# Patient Record
Sex: Female | Born: 1970 | Race: Black or African American | Hispanic: No | Marital: Single | Smoking: Current every day smoker
Health system: Southern US, Community
[De-identification: ages and names within clinical notes are randomized; demographics above are authoritative.]

## PROBLEM LIST (undated history)

## (undated) DIAGNOSIS — F17200 Nicotine dependence, unspecified, uncomplicated: Secondary | ICD-10-CM

## (undated) DIAGNOSIS — I10 Essential (primary) hypertension: Secondary | ICD-10-CM

## (undated) DIAGNOSIS — Z780 Asymptomatic menopausal state: Secondary | ICD-10-CM

## (undated) DIAGNOSIS — F32A Depression, unspecified: Secondary | ICD-10-CM

## (undated) HISTORY — DX: Depression, unspecified: F32.A

## (undated) HISTORY — DX: Essential (primary) hypertension: I10

## (undated) HISTORY — DX: Asymptomatic menopausal state: Z78.0

## (undated) HISTORY — DX: Nicotine dependence, unspecified, uncomplicated: F17.200

---

## 1998-02-24 ENCOUNTER — Encounter: Admission: RE | Admit: 1998-02-24 | Discharge: 1998-02-24 | Payer: Self-pay | Admitting: Family Medicine

## 1998-04-07 ENCOUNTER — Encounter: Admission: RE | Admit: 1998-04-07 | Discharge: 1998-04-07 | Payer: Self-pay | Admitting: Family Medicine

## 1998-04-14 ENCOUNTER — Emergency Department (HOSPITAL_COMMUNITY): Admission: EM | Admit: 1998-04-14 | Discharge: 1998-04-14 | Payer: Self-pay | Admitting: Emergency Medicine

## 1998-04-19 ENCOUNTER — Encounter: Admission: RE | Admit: 1998-04-19 | Discharge: 1998-04-19 | Payer: Self-pay | Admitting: Family Medicine

## 1998-05-26 ENCOUNTER — Emergency Department (HOSPITAL_COMMUNITY): Admission: EM | Admit: 1998-05-26 | Discharge: 1998-05-26 | Payer: Self-pay | Admitting: Emergency Medicine

## 1998-06-21 ENCOUNTER — Emergency Department (HOSPITAL_COMMUNITY): Admission: EM | Admit: 1998-06-21 | Discharge: 1998-06-21 | Payer: Self-pay | Admitting: Emergency Medicine

## 1998-06-22 ENCOUNTER — Encounter: Payer: Self-pay | Admitting: Emergency Medicine

## 1998-11-29 ENCOUNTER — Encounter: Admission: RE | Admit: 1998-11-29 | Discharge: 1998-11-29 | Payer: Self-pay | Admitting: Family Medicine

## 2006-09-09 ENCOUNTER — Emergency Department (HOSPITAL_COMMUNITY): Admission: EM | Admit: 2006-09-09 | Discharge: 2006-09-09 | Payer: Self-pay | Admitting: Family Medicine

## 2006-09-12 ENCOUNTER — Emergency Department (HOSPITAL_COMMUNITY): Admission: EM | Admit: 2006-09-12 | Discharge: 2006-09-12 | Payer: Self-pay | Admitting: Family Medicine

## 2006-09-14 ENCOUNTER — Emergency Department (HOSPITAL_COMMUNITY): Admission: EM | Admit: 2006-09-14 | Discharge: 2006-09-14 | Payer: Self-pay | Admitting: Family Medicine

## 2006-10-15 ENCOUNTER — Emergency Department (HOSPITAL_COMMUNITY): Admission: EM | Admit: 2006-10-15 | Discharge: 2006-10-15 | Payer: Self-pay | Admitting: Emergency Medicine

## 2007-01-15 ENCOUNTER — Emergency Department (HOSPITAL_COMMUNITY): Admission: EM | Admit: 2007-01-15 | Discharge: 2007-01-15 | Payer: Self-pay | Admitting: Emergency Medicine

## 2007-02-25 ENCOUNTER — Emergency Department (HOSPITAL_COMMUNITY): Admission: EM | Admit: 2007-02-25 | Discharge: 2007-02-25 | Payer: Self-pay | Admitting: Family Medicine

## 2007-09-23 ENCOUNTER — Emergency Department (HOSPITAL_COMMUNITY): Admission: EM | Admit: 2007-09-23 | Discharge: 2007-09-23 | Payer: Self-pay | Admitting: Family Medicine

## 2007-12-11 ENCOUNTER — Emergency Department (HOSPITAL_COMMUNITY): Admission: EM | Admit: 2007-12-11 | Discharge: 2007-12-11 | Payer: Self-pay | Admitting: Family Medicine

## 2008-09-03 ENCOUNTER — Emergency Department (HOSPITAL_COMMUNITY): Admission: EM | Admit: 2008-09-03 | Discharge: 2008-09-03 | Payer: Self-pay | Admitting: Emergency Medicine

## 2009-03-19 ENCOUNTER — Emergency Department (HOSPITAL_COMMUNITY): Admission: EM | Admit: 2009-03-19 | Discharge: 2009-03-19 | Payer: Self-pay | Admitting: Emergency Medicine

## 2010-10-10 ENCOUNTER — Emergency Department (HOSPITAL_COMMUNITY)
Admission: EM | Admit: 2010-10-10 | Discharge: 2010-10-10 | Payer: Self-pay | Source: Home / Self Care | Admitting: Family Medicine

## 2010-10-16 LAB — WET PREP, GENITAL
Clue Cells Wet Prep HPF POC: NONE SEEN
Trich, Wet Prep: NONE SEEN
Yeast Wet Prep HPF POC: NONE SEEN

## 2010-10-16 LAB — GC/CHLAMYDIA PROBE AMP, GENITAL
Chlamydia, DNA Probe: NEGATIVE
GC Probe Amp, Genital: NEGATIVE

## 2010-11-04 ENCOUNTER — Emergency Department (HOSPITAL_COMMUNITY)
Admission: EM | Admit: 2010-11-04 | Discharge: 2010-11-04 | Disposition: A | Discharge status: Home / Self Care | Payer: Self-pay | Attending: Emergency Medicine | Admitting: Emergency Medicine

## 2010-11-04 DIAGNOSIS — J020 Streptococcal pharyngitis: Secondary | ICD-10-CM | POA: Insufficient documentation

## 2010-11-04 DIAGNOSIS — R05 Cough: Secondary | ICD-10-CM | POA: Insufficient documentation

## 2010-11-04 DIAGNOSIS — R059 Cough, unspecified: Secondary | ICD-10-CM | POA: Insufficient documentation

## 2010-11-04 DIAGNOSIS — J3489 Other specified disorders of nose and nasal sinuses: Secondary | ICD-10-CM | POA: Insufficient documentation

## 2010-11-04 LAB — RAPID STREP SCREEN (MED CTR MEBANE ONLY): Streptococcus, Group A Screen (Direct): POSITIVE — AB

## 2011-06-25 LAB — POCT URINALYSIS DIP (DEVICE)
Bilirubin Urine: NEGATIVE
Glucose, UA: NEGATIVE
Nitrite: NEGATIVE
Operator id: 247071
Protein, ur: 30 — AB
Specific Gravity, Urine: 1.02
Urobilinogen, UA: 0.2
pH: 6

## 2011-06-25 LAB — WET PREP, GENITAL
Trich, Wet Prep: NONE SEEN
Yeast Wet Prep HPF POC: NONE SEEN

## 2011-06-25 LAB — GC/CHLAMYDIA PROBE AMP, GENITAL
Chlamydia, DNA Probe: NEGATIVE
GC Probe Amp, Genital: NEGATIVE

## 2011-06-25 LAB — RPR: RPR Ser Ql: NONREACTIVE

## 2011-06-25 LAB — POCT PREGNANCY, URINE
Operator id: 247071
Preg Test, Ur: NEGATIVE

## 2011-07-06 LAB — WET PREP, GENITAL
Trich, Wet Prep: NONE SEEN
Yeast Wet Prep HPF POC: NONE SEEN

## 2011-07-06 LAB — GC/CHLAMYDIA PROBE AMP, GENITAL
Chlamydia, DNA Probe: NEGATIVE
GC Probe Amp, Genital: NEGATIVE

## 2011-07-06 LAB — POCT URINALYSIS DIP (DEVICE)
Bilirubin Urine: NEGATIVE
Glucose, UA: NEGATIVE mg/dL
Ketones, ur: NEGATIVE mg/dL
Nitrite: NEGATIVE
Protein, ur: NEGATIVE mg/dL
Specific Gravity, Urine: 1.015 (ref 1.005–1.030)
Urobilinogen, UA: 0.2 mg/dL (ref 0.0–1.0)
pH: 6.5 (ref 5.0–8.0)

## 2011-07-06 LAB — RPR: RPR Ser Ql: NONREACTIVE

## 2011-07-06 LAB — POCT PREGNANCY, URINE: Preg Test, Ur: NEGATIVE

## 2013-09-11 ENCOUNTER — Encounter (HOSPITAL_COMMUNITY): Payer: Self-pay | Admitting: Emergency Medicine

## 2013-09-11 ENCOUNTER — Emergency Department (INDEPENDENT_AMBULATORY_CARE_PROVIDER_SITE_OTHER)
Admission: EM | Admit: 2013-09-11 | Discharge: 2013-09-11 | Disposition: A | Discharge status: Home / Self Care | Payer: Self-pay | Source: Home / Self Care | Attending: Family Medicine | Admitting: Family Medicine

## 2013-09-11 ENCOUNTER — Other Ambulatory Visit (HOSPITAL_COMMUNITY)
Admission: RE | Admit: 2013-09-11 | Discharge: 2013-09-11 | Disposition: A | Discharge status: Home / Self Care | Payer: Self-pay | Source: Ambulatory Visit | Attending: Family Medicine | Admitting: Family Medicine

## 2013-09-11 DIAGNOSIS — N760 Acute vaginitis: Secondary | ICD-10-CM | POA: Insufficient documentation

## 2013-09-11 DIAGNOSIS — Z113 Encounter for screening for infections with a predominantly sexual mode of transmission: Secondary | ICD-10-CM | POA: Insufficient documentation

## 2013-09-11 LAB — HIV ANTIBODY (ROUTINE TESTING W REFLEX): HIV: NONREACTIVE

## 2013-09-11 LAB — RPR: RPR Ser Ql: NONREACTIVE

## 2013-09-11 MED ORDER — FLUCONAZOLE 150 MG PO TABS: 150.0000 mg | ORAL_TABLET | Freq: Once | ORAL | Status: DC

## 2013-09-11 MED ORDER — METRONIDAZOLE 500 MG PO TABS: 500.0000 mg | ORAL_TABLET | Freq: Two times a day (BID) | ORAL | Status: DC

## 2013-09-11 NOTE — ED Notes (Signed)
C/o std States she has sx of abd pain and heavy milky green discharge for 4 days now States she does have odor and did use a suppository for the odor.

## 2013-09-11 NOTE — ED Provider Notes (Signed)
Zoe Parker is a 42 y.o. female who presents to Urgent Care today for vaginal discharge associated with a fishy odor and mild pelvic discomfort. This is been present for the past 4 days. This is somewhat consistent with prior episodes of bacterial vaginosis. She tried an over-the-counter vaginal suppository which did not help much. She denies any fevers or chills nausea vomiting or diarrhea. She feels well otherwise. She is concerned about the possibility of sexually-transmitted infection.    History reviewed. No pertinent past medical history. History  Substance Use Topics  . Smoking status: Not on file  . Smokeless tobacco: Not on file  . Alcohol Use: Not on file   ROS as above Medications reviewed. No current facility-administered medications for this encounter.   Current Outpatient Prescriptions  Medication Sig Dispense Refill  . fluconazole (DIFLUCAN) 150 MG tablet Take 1 tablet (150 mg total) by mouth once.  1 tablet  1  . metroNIDAZOLE (FLAGYL) 500 MG tablet Take 1 tablet (500 mg total) by mouth 2 (two) times daily.  14 tablet  0    Exam:  BP 178/104  Pulse 78  Temp(Src) 98.5 F (36.9 C) (Oral)  SpO2 100%  LMP 08/29/2013 Gen: Well NAD Abd: Soft. NT, ND Exts: Non edematous BL  LE, warm and well perfused.  GYN: Normal external genitalia. Vaginal canal with white to yellowish discharge. Cervix is normal appearing and nontender. No adnexal masses.   Assessment and Plan: 42 y.o. female with vaginal discharge. Likely bacterial vaginosis. Will treat empirically for BV and yeast with metronidazole and fluconazole. Additionally vaginal secretions cytology for gonorrhea, Chlamydia, trichomonas, BV and yeast are pending.  Will call patient with results. Additionally serologic test for HIV and syphilis are pending as well. Discussed warning signs or symptoms. Please see discharge instructions. Patient expresses understanding.      Rodolph Bong, MD 09/11/13 [phone]

## 2013-09-17 NOTE — ED Notes (Signed)
GC/Chlamydia neg., Affirm: Candida and Trich neg., Gardnerella pos., HIV/RPR non-reactive. Pt. adequately treated with Flagyl. Vassie Moselle 09/17/2013

## 2015-04-15 ENCOUNTER — Emergency Department (HOSPITAL_COMMUNITY)
Admission: EM | Admit: 2015-04-15 | Discharge: 2015-04-15 | Disposition: A | Discharge status: Home / Self Care | Payer: Self-pay | Attending: Emergency Medicine | Admitting: Emergency Medicine

## 2015-04-15 ENCOUNTER — Encounter (HOSPITAL_COMMUNITY): Payer: Self-pay

## 2015-04-15 DIAGNOSIS — Z720 Tobacco use: Secondary | ICD-10-CM | POA: Insufficient documentation

## 2015-04-15 DIAGNOSIS — Z79899 Other long term (current) drug therapy: Secondary | ICD-10-CM | POA: Insufficient documentation

## 2015-04-15 DIAGNOSIS — J40 Bronchitis, not specified as acute or chronic: Secondary | ICD-10-CM

## 2015-04-15 DIAGNOSIS — J209 Acute bronchitis, unspecified: Secondary | ICD-10-CM | POA: Insufficient documentation

## 2015-04-15 MED ORDER — ALBUTEROL SULFATE HFA 108 (90 BASE) MCG/ACT IN AERS
2.0000 | INHALATION_SPRAY | RESPIRATORY_TRACT | Status: DC | PRN
  Filled 2015-04-15: qty 6.7

## 2015-04-15 MED ORDER — AZITHROMYCIN 250 MG PO TABS
500.0000 mg | ORAL_TABLET | Freq: Once | ORAL | Status: AC
  Administered 2015-04-15: 500 mg via ORAL
  Filled 2015-04-15: qty 2

## 2015-04-15 MED ORDER — PREDNISONE 20 MG PO TABS
60.0000 mg | ORAL_TABLET | Freq: Once | ORAL | Status: AC
  Administered 2015-04-15: 60 mg via ORAL
  Filled 2015-04-15: qty 3

## 2015-04-15 MED ORDER — AZITHROMYCIN 250 MG PO TABS: 250.0000 mg | ORAL_TABLET | Freq: Every day | ORAL | Status: DC

## 2015-04-15 MED ORDER — PREDNISONE 20 MG PO TABS: 60.0000 mg | ORAL_TABLET | Freq: Every day | ORAL | Status: AC

## 2015-04-15 NOTE — ED Notes (Signed)
Per pt, she states that she caught a cold about 2 months ago and has been coughing ever since. The pt states that the cough is productive, with thick sputum ranging from green, yellow, white, and clear. Pt states that she has attempted to treat the cough at home with various medications but has been unsuccessful.

## 2015-04-15 NOTE — Discharge Instructions (Signed)
Acute Bronchitis Bronchitis is inflammation of the airways that extend from the windpipe into the lungs (bronchi). The inflammation often causes mucus to develop. This leads to a cough, which is the most common symptom of bronchitis.  In acute bronchitis, the condition usually develops suddenly and goes away over time, usually in a couple weeks. Smoking, allergies, and asthma can make bronchitis worse. Repeated episodes of bronchitis may cause further lung problems.  CAUSES Acute bronchitis is most often caused by the same virus that causes a cold. The virus can spread from person to person (contagious) through coughing, sneezing, and touching contaminated objects. SIGNS AND SYMPTOMS   Cough.   Fever.   Coughing up mucus.   Body aches.   Chest congestion.   Chills.   Shortness of breath.   Sore throat.  DIAGNOSIS  Acute bronchitis is usually diagnosed through a physical exam. Your health care provider will also ask you questions about your medical history. Tests, such as chest X-rays, are sometimes done to rule out other conditions.  TREATMENT  Acute bronchitis usually goes away in a couple weeks. Oftentimes, no medical treatment is necessary. Medicines are sometimes given for relief of fever or cough. Antibiotic medicines are usually not needed but may be prescribed in certain situations. In some cases, an inhaler may be recommended to help reduce shortness of breath and control the cough. A cool mist vaporizer may also be used to help thin bronchial secretions and make it easier to clear the chest.  HOME CARE INSTRUCTIONS  Get plenty of rest.   Drink enough fluids to keep your urine clear or pale yellow (unless you have a medical condition that requires fluid restriction). Increasing fluids may help thin your respiratory secretions (sputum) and reduce chest congestion, and it will prevent dehydration.   Take medicines only as directed by your health care provider.  If  you were prescribed an antibiotic medicine, finish it all even if you start to feel better.  Avoid smoking and secondhand smoke. Exposure to cigarette smoke or irritating chemicals will make bronchitis worse. If you are a smoker, consider using nicotine gum or skin patches to help control withdrawal symptoms. Quitting smoking will help your lungs heal faster.   Reduce the chances of another bout of acute bronchitis by washing your hands frequently, avoiding people with cold symptoms, and trying not to touch your hands to your mouth, nose, or eyes.   Keep all follow-up visits as directed by your health care provider.  SEEK MEDICAL CARE IF: Your symptoms do not improve after 1 week of treatment.  SEEK IMMEDIATE MEDICAL CARE IF:  You develop an increased fever or chills.   You have chest pain.   You have severe shortness of breath.  You have bloody sputum.   You develop dehydration.  You faint or repeatedly feel like you are going to pass out.  You develop repeated vomiting.  You develop a severe headache. MAKE SURE YOU:   Understand these instructions.  Will watch your condition.  Will get help right away if you are not doing well or get worse. Document Released: 10/25/2004 Document Revised: 02/01/2014 Document Reviewed: 03/10/2013 ExitCare Patient Information 2015 [employer]. This information is not intended to replace advice given to you by your health care provider. Make sure you discuss any questions you have with your health care provider. Smoking Cessation Quitting smoking is important to your health and has many advantages. However, it is not always easy to quit since nicotine is   a very addictive drug. Oftentimes, people try 3 times or more before being able to quit. This document explains the best ways for you to prepare to quit smoking. Quitting takes hard work and a lot of effort, but you can do it. ADVANTAGES OF QUITTING SMOKING  You will live longer, feel  better, and live better.  Your body will feel the impact of quitting smoking almost immediately.  Within 20 minutes, blood pressure decreases. Your pulse returns to its normal level.  After 8 hours, carbon monoxide levels in the blood return to normal. Your oxygen level increases.  After 24 hours, the chance of having a heart attack starts to decrease. Your breath, hair, and body stop smelling like smoke.  After 48 hours, damaged nerve endings begin to recover. Your sense of taste and smell improve.  After 72 hours, the body is virtually free of nicotine. Your bronchial tubes relax and breathing becomes easier.  After 2 to 12 weeks, lungs can hold more air. Exercise becomes easier and circulation improves.  The risk of having a heart attack, stroke, cancer, or lung disease is greatly reduced.  After 1 year, the risk of coronary heart disease is cut in half.  After 5 years, the risk of stroke falls to the same as a nonsmoker.  After 10 years, the risk of lung cancer is cut in half and the risk of other cancers decreases significantly.  After 15 years, the risk of coronary heart disease drops, usually to the level of a nonsmoker.  If you are pregnant, quitting smoking will improve your chances of having a healthy baby.  The people you live with, especially any children, will be healthier.  You will have extra money to spend on things other than cigarettes. QUESTIONS TO THINK ABOUT BEFORE ATTEMPTING TO QUIT You may want to talk about your answers with your health care provider.  Why do you want to quit?  If you tried to quit in the past, what helped and what did not?  What will be the most difficult situations for you after you quit? How will you plan to handle them?  Who can help you through the tough times? Your family? Friends? A health care provider?  What pleasures do you get from smoking? What ways can you still get pleasure if you quit? Here are some questions to ask  your health care provider:  How can you help me to be successful at quitting?  What medicine do you think would be best for me and how should I take it?  What should I do if I need more help?  What is smoking withdrawal like? How can I get information on withdrawal? GET READY  Set a quit date.  Change your environment by getting rid of all cigarettes, ashtrays, matches, and lighters in your home, car, or work. Do not let people smoke in your home.  Review your past attempts to quit. Think about what worked and what did not. GET SUPPORT AND ENCOURAGEMENT You have a better chance of being successful if you have help. You can get support in many ways.  Tell your family, friends, and coworkers that you are going to quit and need their support. Ask them not to smoke around you.  Get individual, group, or telephone counseling and support. Programs are available at local hospitals and health centers. Call your local health department for information about programs in your area.  Spiritual beliefs and practices may help some smokers quit.  Download   a "quit meter" on your computer to keep track of quit statistics, such as how long you have gone without smoking, cigarettes not smoked, and money saved.  Get a self-help book about quitting smoking and staying off tobacco. LEARN NEW SKILLS AND BEHAVIORS  Distract yourself from urges to smoke. Talk to someone, go for a walk, or occupy your time with a task.  Change your normal routine. Take a different route to work. Drink tea instead of coffee. Eat breakfast in a different place.  Reduce your stress. Take a hot bath, exercise, or read a book.  Plan something enjoyable to do every day. Reward yourself for not smoking.  Explore interactive web-based programs that specialize in helping you quit. GET MEDICINE AND USE IT CORRECTLY Medicines can help you stop smoking and decrease the urge to smoke. Combining medicine with the above behavioral  methods and support can greatly increase your chances of successfully quitting smoking.  Nicotine replacement therapy helps deliver nicotine to your body without the negative effects and risks of smoking. Nicotine replacement therapy includes nicotine gum, lozenges, inhalers, nasal sprays, and skin patches. Some may be available over-the-counter and others require a prescription.  Antidepressant medicine helps people abstain from smoking, but how this works is unknown. This medicine is available by prescription.  Nicotinic receptor partial agonist medicine simulates the effect of nicotine in your brain. This medicine is available by prescription. Ask your health care provider for advice about which medicines to use and how to use them based on your health history. Your health care provider will tell you what side effects to look out for if you choose to be on a medicine or therapy. Carefully read the information on the package. Do not use any other product containing nicotine while using a nicotine replacement product.  RELAPSE OR DIFFICULT SITUATIONS Most relapses occur within the first 3 months after quitting. Do not be discouraged if you start smoking again. Remember, most people try several times before finally quitting. You may have symptoms of withdrawal because your body is used to nicotine. You may crave cigarettes, be irritable, feel very hungry, cough often, get headaches, or have difficulty concentrating. The withdrawal symptoms are only temporary. They are strongest when you first quit, but they will go away within 10-14 days. To reduce the chances of relapse, try to:  Avoid drinking alcohol. Drinking lowers your chances of successfully quitting.  Reduce the amount of caffeine you consume. Once you quit smoking, the amount of caffeine in your body increases and can give you symptoms, such as a rapid heartbeat, sweating, and anxiety.  Avoid smokers because they can make you want to  smoke.  Do not let weight gain distract you. Many smokers will gain weight when they quit, usually less than 10 pounds. Eat a healthy diet and stay active. You can always lose the weight gained after you quit.  Find ways to improve your mood other than smoking. FOR MORE INFORMATION  www.smokefree.gov  Document Released: 09/11/2001 Document Revised: 02/01/2014 Document Reviewed: 12/27/2011 ExitCare Patient Information 2015 [employer]. This information is not intended to replace advice given to you by your health care provider. Make sure you discuss any questions you have with your health care provider.  

## 2015-04-15 NOTE — ED Provider Notes (Signed)
CSN: [id number]     Arrival date & time 04/15/15  [id number] History   First MD Initiated Contact with Patient 04/15/15 [phone]     Chief Complaint  Patient presents with  . Cough     (Consider location/radiation/quality/duration/timing/severity/associated sxs/prior Treatment) Patient is a 44 y.o. female presenting with cough. The history is provided by the patient. No language interpreter was used.  Cough Cough characteristics:  Productive Severity:  Moderate Duration:  8 weeks Smoker: yes   Associated symptoms: no chills, no fever, no myalgias, no shortness of breath and no sore throat   Associated symptoms comment:  Cough, worse at night but present during the day, without fever, constant for 2 months. No vomiting. She has tried over-the-counter medications without relief. She has no history of asthma and is on no regular medications.    History reviewed. No pertinent past medical history. History reviewed. No pertinent past surgical history. No family history on file. History  Substance Use Topics  . Smoking status: Current Some Day Smoker  . Smokeless tobacco: Never Used  . Alcohol Use: Yes     Comment: "Occasionally"    OB History    No data available     Review of Systems  Constitutional: Negative for fever and chills.  HENT: Negative.  Negative for congestion and sore throat.   Respiratory: Positive for cough. Negative for shortness of breath.   Cardiovascular: Negative.   Gastrointestinal: Negative.  Negative for vomiting.  Musculoskeletal: Negative.  Negative for myalgias.  Skin: Negative.   Neurological: Negative.       Allergies  Review of patient's allergies indicates no known allergies.  Home Medications   Prior to Admission medications   Medication Sig Start Date End Date Taking? Authorizing Provider  dextromethorphan-guaiFENesin (MUCINEX DM) 30-600 MG per 12 hr tablet Take 1 tablet by mouth 2 (two) times daily as needed for cough.   Yes Historical  Provider, MD  guaiFENesin-dextromethorphan (ROBITUSSIN DM) 100-10 MG/5ML syrup Take 15 mLs by mouth every 4 (four) hours as needed for cough.   Yes Historical Provider, MD  fluconazole (DIFLUCAN) 150 MG tablet Take 1 tablet (150 mg total) by mouth once. Patient not taking: Reported on 04/15/2015 09/11/13   Rodolph BongEvan S Corey, MD  metroNIDAZOLE (FLAGYL) 500 MG tablet Take 1 tablet (500 mg total) by mouth 2 (two) times daily. Patient not taking: Reported on 04/15/2015 09/11/13   Rodolph BongEvan S Corey, MD   BP 161/88 mmHg  Pulse 83  Temp(Src) 99.5 F (37.5 C) (Oral)  Resp 17  SpO2 99%  LMP 04/03/2015 Physical Exam  Constitutional: She is oriented to person, place, and time. She appears well-developed and well-nourished.  HENT:  Head: Normocephalic.  Eyes: Conjunctivae are normal.  Neck: Normal range of motion. Neck supple.  Cardiovascular: Normal rate and regular rhythm.   Pulmonary/Chest: Effort normal. She has no wheezes. She has no rales. She exhibits no tenderness.  Few scattered rhonchi.  Abdominal: Soft. Bowel sounds are normal. There is no tenderness. There is no rebound and no guarding.  Musculoskeletal: Normal range of motion. She exhibits no edema.  Neurological: She is alert and oriented to person, place, and time.  Skin: Skin is warm and dry. No rash noted.  Psychiatric: She has a normal mood and affect.    ED Course  Procedures (including critical care time) Labs Review Labs Reviewed - No data to display  Imaging Review No results found.   EKG Interpretation None      MDM  Final diagnoses:  None    1. Bronchitis 2. Tobacco abuse  Patient is well appearing, in NAD. No wheezing, mild rhonchi, symptoms of 2 months duration. Will provide steroid (3 days), inhaler, and Z-pack. Stable for dischare with normal VS, no hypoxia or compromise.   Elpidio Anis, PA-C 04/15/15 1610  Devoria Albe, MD 04/15/15 [phone]

## 2015-06-03 ENCOUNTER — Emergency Department (HOSPITAL_COMMUNITY)
Admission: EM | Admit: 2015-06-03 | Discharge: 2015-06-03 | Disposition: A | Discharge status: Home / Self Care | Payer: Self-pay | Attending: Emergency Medicine | Admitting: Emergency Medicine

## 2015-06-03 ENCOUNTER — Encounter (HOSPITAL_COMMUNITY): Payer: Self-pay | Admitting: Emergency Medicine

## 2015-06-03 DIAGNOSIS — M549 Dorsalgia, unspecified: Secondary | ICD-10-CM | POA: Insufficient documentation

## 2015-06-03 DIAGNOSIS — R509 Fever, unspecified: Secondary | ICD-10-CM | POA: Insufficient documentation

## 2015-06-03 DIAGNOSIS — Z3202 Encounter for pregnancy test, result negative: Secondary | ICD-10-CM | POA: Insufficient documentation

## 2015-06-03 DIAGNOSIS — Z79899 Other long term (current) drug therapy: Secondary | ICD-10-CM | POA: Insufficient documentation

## 2015-06-03 DIAGNOSIS — R51 Headache: Secondary | ICD-10-CM | POA: Insufficient documentation

## 2015-06-03 DIAGNOSIS — Z720 Tobacco use: Secondary | ICD-10-CM | POA: Insufficient documentation

## 2015-06-03 DIAGNOSIS — R102 Pelvic and perineal pain: Secondary | ICD-10-CM | POA: Insufficient documentation

## 2015-06-03 DIAGNOSIS — R1084 Generalized abdominal pain: Secondary | ICD-10-CM | POA: Insufficient documentation

## 2015-06-03 DIAGNOSIS — R61 Generalized hyperhidrosis: Secondary | ICD-10-CM | POA: Insufficient documentation

## 2015-06-03 LAB — CBC
HCT: 39.5 % (ref 36.0–46.0)
HEMOGLOBIN: 12.8 g/dL (ref 12.0–15.0)
MCH: 26.8 pg (ref 26.0–34.0)
MCHC: 32.4 g/dL (ref 30.0–36.0)
MCV: 82.6 fL (ref 78.0–100.0)
Platelets: 254 10*3/uL (ref 150–400)
RBC: 4.78 MIL/uL (ref 3.87–5.11)
RDW: 14.6 % (ref 11.5–15.5)
WBC: 8 10*3/uL (ref 4.0–10.5)

## 2015-06-03 LAB — COMPREHENSIVE METABOLIC PANEL
ALBUMIN: 3.9 g/dL (ref 3.5–5.0)
ALT: 14 U/L (ref 14–54)
ANION GAP: 9 (ref 5–15)
AST: 18 U/L (ref 15–41)
Alkaline Phosphatase: 59 U/L (ref 38–126)
BUN: 9 mg/dL (ref 6–20)
CO2: 24 mmol/L (ref 22–32)
Calcium: 8.8 mg/dL — ABNORMAL LOW (ref 8.9–10.3)
Chloride: 103 mmol/L (ref 101–111)
Creatinine, Ser: 0.67 mg/dL (ref 0.44–1.00)
GFR calc Af Amer: >60 mL/min (ref >60)
GFR calc non Af Amer: >60 mL/min (ref >60)
GLUCOSE: 137 mg/dL — AB (ref 65–99)
POTASSIUM: 3.1 mmol/L — AB (ref 3.5–5.1)
SODIUM: 136 mmol/L (ref 135–145)
Total Bilirubin: 1 mg/dL (ref 0.3–1.2)
Total Protein: 7.3 g/dL (ref 6.5–8.1)

## 2015-06-03 LAB — URINE MICROSCOPIC-ADD ON

## 2015-06-03 LAB — HIV ANTIBODY (ROUTINE TESTING W REFLEX): HIV Screen 4th Generation wRfx: NONREACTIVE

## 2015-06-03 LAB — WET PREP, GENITAL
Clue Cells Wet Prep HPF POC: NONE SEEN
Trich, Wet Prep: NONE SEEN
Yeast Wet Prep HPF POC: NONE SEEN

## 2015-06-03 LAB — URINALYSIS, ROUTINE W REFLEX MICROSCOPIC
Bilirubin Urine: NEGATIVE
GLUCOSE, UA: NEGATIVE mg/dL
Ketones, ur: NEGATIVE mg/dL
Leukocytes, UA: NEGATIVE
Nitrite: NEGATIVE
PH: 6 (ref 5.0–8.0)
Protein, ur: 30 mg/dL — AB
SPECIFIC GRAVITY, URINE: 1.028 (ref 1.005–1.030)
Urobilinogen, UA: 1 mg/dL (ref 0.0–1.0)

## 2015-06-03 LAB — GC/CHLAMYDIA PROBE AMP (~~LOC~~) NOT AT ARMC
Chlamydia: NEGATIVE
NEISSERIA GONORRHEA: NEGATIVE

## 2015-06-03 LAB — POC URINE PREG, ED: PREG TEST UR: NEGATIVE

## 2015-06-03 LAB — LIPASE, BLOOD: Lipase: 11 U/L — ABNORMAL LOW (ref 22–51)

## 2015-06-03 MED ORDER — NAPROXEN 500 MG PO TABS: 500.0000 mg | ORAL_TABLET | Freq: Two times a day (BID) | ORAL | Status: DC

## 2015-06-03 MED ORDER — OXYCODONE-ACETAMINOPHEN 5-325 MG PO TABS
1.0000 | ORAL_TABLET | Freq: Once | ORAL | Status: AC
  Administered 2015-06-03: 1 via ORAL
  Filled 2015-06-03: qty 1

## 2015-06-03 NOTE — ED Provider Notes (Signed)
CSN: [id number]   Arrival date & time 06/03/15 0032  History  This chart was scribed for Jerelyn Scott, MD by Bethel Born, ED Scribe. This patient was seen in room A02C/A02C and the patient's care was started at 1:38 AM.  Chief Complaint  Patient presents with  . Abdominal Pain    HPI Patient is a 44 y.o. female presenting with abdominal pain. The history is provided by the patient. No language interpreter was used.  Abdominal Pain Pain location:  RLQ Pain quality: aching   Pain severity:  Severe Onset quality:  Gradual Duration:  1 day Timing:  Constant Progression:  Worsening Chronicity:  New Context: not trauma   Relieved by:  Nothing Worsened by:  Nothing tried Ineffective treatments:  None tried Associated symptoms: chills and fever (Subjective)   Associated symptoms: no dysuria, no nausea and no vomiting    Zoe Parker is a 44 y.o. female who presents to the Emergency Department complaining of constant right sided abdominal pain with radiation to right lower back with gradual onset last evening. She describes the pain as aching and rates it 8/10 in severity.  She states symptoms started yesterday with headache, sweating and chills, pain all over body, burning with urination, cloudy urine as well as upper back pain.  Associated symptoms include pelvic pain, right lower back pain, subjective fever, chills, and sweating. She is concerned for a UTI. She states she did not measure her temperature.  Did not try any treatments at home.  Also complains of headache and neck stiffness that she associates with a cold. Pt denies dysuria, nausea, and vomiting.   History reviewed. No pertinent past medical history.  History reviewed. No pertinent past surgical history.  No family history on file.  Social History  Substance Use Topics  . Smoking status: Current Some Day Smoker  . Smokeless tobacco: Never Used  . Alcohol Use: Yes     Comment: "Occasionally"      Review of Systems   Constitutional: Positive for fever (Subjective), chills and diaphoresis.  Gastrointestinal: Positive for abdominal pain. Negative for nausea and vomiting.  Genitourinary: Positive for pelvic pain. Negative for dysuria.  Musculoskeletal: Positive for back pain and neck stiffness.  Neurological: Positive for headaches.  All other systems reviewed and are negative.   Home Medications   Prior to Admission medications   Medication Sig Start Date End Date Taking? Authorizing Provider  albuterol (PROVENTIL HFA;VENTOLIN HFA) 108 (90 BASE) MCG/ACT inhaler Inhale 1-2 puffs into the lungs every 6 (six) hours as needed for wheezing or shortness of breath.   Yes Historical Provider, MD  dextromethorphan-guaiFENesin (MUCINEX DM) 30-600 MG per 12 hr tablet Take 1 tablet by mouth 2 (two) times daily as needed for cough.   Yes Historical Provider, MD  guaiFENesin-dextromethorphan (ROBITUSSIN DM) 100-10 MG/5ML syrup Take 15 mLs by mouth every 4 (four) hours as needed for cough.   Yes Historical Provider, MD  naproxen (NAPROSYN) 500 MG tablet Take 1 tablet (500 mg total) by mouth 2 (two) times daily. 06/03/15   Jerelyn Scott, MD    Allergies  Review of patient's allergies indicates no known allergies.  Triage Vitals: BP 158/99 mmHg  Pulse 96  Temp(Src) 99.2 F (37.3 C) (Oral)  Resp 16  SpO2 97%  LMP 05/20/2015 Vitals reviewed Physical Exam  Physical Examination: General appearance - alert, well appearing, and in no distress Mental status - alert, oriented to person, place, and time Eyes - no conjunctival injection, no scleral icterus  Mouth - mucous membranes moist, pharynx normal without lesions Chest - clear to auscultation, no wheezes, rales or rhonchi, symmetric air entry Heart - normal rate, regular rhythm, normal S1, S2, no murmurs, rubs, clicks or gallops Abdomen - soft, nontender to palpation, , nondistended, no masses or organomegaly, nabs Pelvic - normal external genitalia, vulva, vagina,  cervix, uterus and adnexa, no adnexal tenderness or CMT Back exam - full range of motion, no midline tenderness, mild right paraspinal tenderness in lumbar region, no CVA tenderness Neurological - alert, oriented, normal speech Extremities - peripheral pulses normal, no pedal edema, no clubbing or cyanosis Skin - normal coloration and turgor, no rashes  ED Course  Procedures   DIAGNOSTIC STUDIES: Oxygen Saturation is 97% on RA, normal by my interpretation.    COORDINATION OF CARE: 1:43 AM Discussed treatment plan which includes lab work and pelvic exam with pt at bedside and pt agreed to plan.  Labs Reviewed  WET PREP, GENITAL - Abnormal; Notable for the following:    WBC, Wet Prep HPF POC FEW (*)    All other components within normal limits  LIPASE, BLOOD - Abnormal; Notable for the following:    Lipase 11 (*)    All other components within normal limits  COMPREHENSIVE METABOLIC PANEL - Abnormal; Notable for the following:    Potassium 3.1 (*)    Glucose, Bld 137 (*)    Calcium 8.8 (*)    All other components within normal limits  URINALYSIS, ROUTINE W REFLEX MICROSCOPIC (NOT AT [facility]) - Abnormal; Notable for the following:    APPearance CLOUDY (*)    Hgb urine dipstick MODERATE (*)    Protein, ur 30 (*)    All other components within normal limits  URINE MICROSCOPIC-ADD ON - Abnormal; Notable for the following:    Squamous Epithelial / LPF MANY (*)    Bacteria, UA MANY (*)    All other components within normal limits  URINE CULTURE  CBC  HIV ANTIBODY (ROUTINE TESTING)  POC URINE PREG, ED  GC/CHLAMYDIA PROBE AMP ([place]) NOT AT [facility]    I, No att. providers found, personally reviewed and evaluated these images and lab results as part of my medical decision-making.  Imaging Review No results found.  EKG Interpretation None      MDM   Final diagnoses:  Generalized abdominal pain   Pt presenting with abdominal pain and right sided low back pain in addition  to headache, diffuse body aches, subjective fever.  Abdominal exam is benign, labs are reassuring with no elevation in wbc, no sign of UTI- urine culture is pending.  Pelvic exam was also reassuring, wet prep with few WBCs, no adnexal tenderness or CMT to suggest PID.  Pt feels some improvement after pain meds in the ED.  Discharged with strict return precautions.  Pt agreeable with plan.   I personally performed the services described in this documentation, which was scribed in my presence. The recorded information has been reviewed and is accurate.    Jerelyn Scott, MD 06/04/15 [phone]

## 2015-06-03 NOTE — ED Notes (Addendum)
Pt. presents with multiple complaints : reports RLQ pain , pelvic pain , low back pain , nausea , cloudy urine , chills and upper back pain onset today .

## 2015-06-03 NOTE — ED Notes (Signed)
MD at bedside. 

## 2015-06-03 NOTE — Discharge Instructions (Signed)
Return to the ED with any concerns including vomiting and not able to keep down liquids, fever, worsening pain, decreased level of alertness/lethargy, or any other alarming symptoms

## 2015-06-04 ENCOUNTER — Emergency Department (HOSPITAL_COMMUNITY): Payer: Self-pay

## 2015-06-04 ENCOUNTER — Encounter (HOSPITAL_COMMUNITY): Payer: Self-pay

## 2015-06-04 ENCOUNTER — Emergency Department (HOSPITAL_COMMUNITY)
Admission: EM | Admit: 2015-06-04 | Discharge: 2015-06-04 | Disposition: A | Discharge status: Home / Self Care | Payer: Self-pay | Attending: Emergency Medicine | Admitting: Emergency Medicine

## 2015-06-04 DIAGNOSIS — H53149 Visual discomfort, unspecified: Secondary | ICD-10-CM | POA: Insufficient documentation

## 2015-06-04 DIAGNOSIS — Z79899 Other long term (current) drug therapy: Secondary | ICD-10-CM | POA: Insufficient documentation

## 2015-06-04 DIAGNOSIS — R51 Headache: Secondary | ICD-10-CM | POA: Insufficient documentation

## 2015-06-04 DIAGNOSIS — R112 Nausea with vomiting, unspecified: Secondary | ICD-10-CM | POA: Insufficient documentation

## 2015-06-04 DIAGNOSIS — Z3202 Encounter for pregnancy test, result negative: Secondary | ICD-10-CM | POA: Insufficient documentation

## 2015-06-04 DIAGNOSIS — Z720 Tobacco use: Secondary | ICD-10-CM | POA: Insufficient documentation

## 2015-06-04 DIAGNOSIS — R519 Headache, unspecified: Secondary | ICD-10-CM

## 2015-06-04 DIAGNOSIS — R109 Unspecified abdominal pain: Secondary | ICD-10-CM | POA: Insufficient documentation

## 2015-06-04 LAB — BASIC METABOLIC PANEL
ANION GAP: 8 (ref 5–15)
BUN: 11 mg/dL (ref 6–20)
CALCIUM: 8.8 mg/dL — AB (ref 8.9–10.3)
CO2: 29 mmol/L (ref 22–32)
Chloride: 105 mmol/L (ref 101–111)
Creatinine, Ser: 0.6 mg/dL (ref 0.44–1.00)
GFR calc Af Amer: >60 mL/min (ref >60)
GLUCOSE: 110 mg/dL — AB (ref 65–99)
POTASSIUM: 3.1 mmol/L — AB (ref 3.5–5.1)
SODIUM: 142 mmol/L (ref 135–145)

## 2015-06-04 LAB — URINE CULTURE

## 2015-06-04 LAB — URINALYSIS, ROUTINE W REFLEX MICROSCOPIC
BILIRUBIN URINE: NEGATIVE
GLUCOSE, UA: NEGATIVE mg/dL
HGB URINE DIPSTICK: NEGATIVE
Ketones, ur: NEGATIVE mg/dL
Leukocytes, UA: NEGATIVE
Nitrite: NEGATIVE
PROTEIN: NEGATIVE mg/dL
SPECIFIC GRAVITY, URINE: 1.022 (ref 1.005–1.030)
UROBILINOGEN UA: 1 mg/dL (ref 0.0–1.0)
pH: 7.5 (ref 5.0–8.0)

## 2015-06-04 LAB — CBC WITH DIFFERENTIAL/PLATELET
BASOS ABS: 0 10*3/uL (ref 0.0–0.1)
BASOS PCT: 0 % (ref 0–1)
EOS ABS: 0.1 10*3/uL (ref 0.0–0.7)
EOS PCT: 2 % (ref 0–5)
HCT: 38.2 % (ref 36.0–46.0)
Hemoglobin: 12.4 g/dL (ref 12.0–15.0)
Lymphocytes Relative: 32 % (ref 12–46)
Lymphs Abs: 2.4 10*3/uL (ref 0.7–4.0)
MCH: 27.2 pg (ref 26.0–34.0)
MCHC: 32.5 g/dL (ref 30.0–36.0)
MCV: 83.8 fL (ref 78.0–100.0)
MONO ABS: 0.4 10*3/uL (ref 0.1–1.0)
Monocytes Relative: 6 % (ref 3–12)
Neutro Abs: 4.5 10*3/uL (ref 1.7–7.7)
Neutrophils Relative %: 60 % (ref 43–77)
PLATELETS: 261 10*3/uL (ref 150–400)
RBC: 4.56 MIL/uL (ref 3.87–5.11)
RDW: 14.6 % (ref 11.5–15.5)
WBC: 7.5 10*3/uL (ref 4.0–10.5)

## 2015-06-04 LAB — I-STAT BETA HCG BLOOD, ED (MC, WL, AP ONLY)

## 2015-06-04 MED ORDER — ACETAMINOPHEN 500 MG PO TABS
1000.0000 mg | ORAL_TABLET | Freq: Once | ORAL | Status: AC
  Administered 2015-06-04: 1000 mg via ORAL
  Filled 2015-06-04: qty 2

## 2015-06-04 MED ORDER — SODIUM CHLORIDE 0.9 % IV BOLUS (SEPSIS)
1000.0000 mL | Freq: Once | INTRAVENOUS | Status: AC
  Administered 2015-06-04: 1000 mL via INTRAVENOUS

## 2015-06-04 MED ORDER — KETOROLAC TROMETHAMINE 30 MG/ML IJ SOLN
30.0000 mg | Freq: Once | INTRAMUSCULAR | Status: AC
  Administered 2015-06-04: 30 mg via INTRAVENOUS
  Filled 2015-06-04: qty 1

## 2015-06-04 MED ORDER — METOCLOPRAMIDE HCL 5 MG/ML IJ SOLN
10.0000 mg | Freq: Once | INTRAMUSCULAR | Status: AC
  Administered 2015-06-04: 10 mg via INTRAVENOUS
  Filled 2015-06-04: qty 2

## 2015-06-04 MED ORDER — DEXAMETHASONE SODIUM PHOSPHATE 10 MG/ML IJ SOLN
10.0000 mg | Freq: Once | INTRAMUSCULAR | Status: AC
  Administered 2015-06-04: 10 mg via INTRAVENOUS
  Filled 2015-06-04: qty 1

## 2015-06-04 MED ORDER — DIPHENHYDRAMINE HCL 50 MG/ML IJ SOLN
25.0000 mg | Freq: Once | INTRAMUSCULAR | Status: AC
  Administered 2015-06-04: 25 mg via INTRAVENOUS
  Filled 2015-06-04: qty 1

## 2015-06-04 NOTE — ED Notes (Signed)
Patient transported to CT 

## 2015-06-04 NOTE — ED Notes (Signed)
Pt a+o. Respirations are even and unlabored. ABCs intact.

## 2015-06-04 NOTE — ED Notes (Signed)
Pt given written and verbalized discharge instructions. Pt verbalized understanding.

## 2015-06-04 NOTE — Discharge Instructions (Signed)

## 2015-06-04 NOTE — ED Notes (Signed)
Pt's blood pressure is 189/114. MD aware.

## 2015-06-04 NOTE — ED Provider Notes (Signed)
CSN: [id number]     Arrival date & time 06/04/15  0800 History   First MD Initiated Contact with Patient 06/04/15 [phone]     Chief Complaint  Patient presents with  . Headache     (Consider location/radiation/quality/duration/timing/severity/associated sxs/prior Treatment) Patient is a 44 y.o. female presenting with headaches.  Headache Pain location:  Occipital (bil retroorbital) Quality:  Dull Radiates to:  Does not radiate Onset quality:  Gradual Duration:  4 days Timing:  Constant Progression:  Unchanged Chronicity:  New Similar to prior headaches: no   Context comment:  Visit last night for abd pain with benign wu Relieved by:  Nothing Worsened by:  Neck movement Associated symptoms: abdominal pain, nausea, photophobia and vomiting   Associated symptoms: no back pain, no blurred vision, no congestion, no cough, no diarrhea, no dizziness, no focal weakness and no URI     History reviewed. No pertinent past medical history. History reviewed. No pertinent past surgical history. History reviewed. No pertinent family history. Social History  Substance Use Topics  . Smoking status: Current Some Day Smoker  . Smokeless tobacco: Never Used  . Alcohol Use: Yes     Comment: "Occasionally"    OB History    No data available     Review of Systems  HENT: Negative for congestion.   Eyes: Positive for photophobia. Negative for blurred vision.  Respiratory: Negative for cough.   Gastrointestinal: Positive for nausea, vomiting and abdominal pain. Negative for diarrhea.  Musculoskeletal: Negative for back pain.  Neurological: Positive for headaches. Negative for dizziness and focal weakness.  All other systems reviewed and are negative.     Allergies  Review of patient's allergies indicates no known allergies.  Home Medications   Prior to Admission medications   Medication Sig Start Date End Date Taking? Authorizing Provider  acetaminophen (TYLENOL) 325 MG tablet Take 650  mg by mouth every 4 (four) hours as needed.   Yes Historical Provider, MD  albuterol (PROVENTIL HFA;VENTOLIN HFA) 108 (90 BASE) MCG/ACT inhaler Inhale 1-2 puffs into the lungs every 6 (six) hours as needed for wheezing or shortness of breath.   Yes Historical Provider, MD  dextromethorphan-guaiFENesin (MUCINEX DM) 30-600 MG per 12 hr tablet Take 1 tablet by mouth 2 (two) times daily as needed for cough.   Yes Historical Provider, MD  guaiFENesin-dextromethorphan (ROBITUSSIN DM) 100-10 MG/5ML syrup Take 15 mLs by mouth every 4 (four) hours as needed for cough.   Yes Historical Provider, MD  naproxen (NAPROSYN) 500 MG tablet Take 1 tablet (500 mg total) by mouth 2 (two) times daily. Patient not taking: Reported on 06/04/2015 06/03/15   Jerelyn Scott, MD   BP 134/79 mmHg  Pulse 62  Temp(Src) 99.6 F (37.6 C) (Oral)  Resp 15  Ht  (1.549 m)  Wt 203 lb (92.08 kg)  BMI 38.38 kg/m2  SpO2 97%  LMP 05/20/2015 Physical Exam  Constitutional: She is oriented to person, place, and time. She appears well-developed and well-nourished.  HENT:  Head: Normocephalic and atraumatic.  Right Ear: External ear normal.  Left Ear: External ear normal.  Eyes: Conjunctivae and EOM are normal. Pupils are equal, round, and reactive to light.  Neck: Normal range of motion. Neck supple.  Cardiovascular: Normal rate, regular rhythm, normal heart sounds and intact distal pulses.   Pulmonary/Chest: Effort normal and breath sounds normal.  Abdominal: Soft. Bowel sounds are normal. There is no tenderness.  Musculoskeletal: Normal range of motion.  Neurological: She is alert and  oriented to person, place, and time. She has normal strength and normal reflexes. No cranial nerve deficit or sensory deficit. Coordination normal. GCS eye subscore is 4. GCS verbal subscore is 5. GCS motor subscore is 6.  Skin: Skin is warm and dry.  Vitals reviewed.   ED Course  Procedures (including critical care time) Labs Review Labs  Reviewed  BASIC METABOLIC PANEL - Abnormal; Notable for the following:    Potassium 3.1 (*)    Glucose, Bld 110 (*)    Calcium 8.8 (*)    All other components within normal limits  URINALYSIS, ROUTINE W REFLEX MICROSCOPIC (NOT AT [facility]) - Abnormal; Notable for the following:    APPearance CLOUDY (*)    All other components within normal limits  CBC WITH DIFFERENTIAL/PLATELET  I-STAT BETA HCG BLOOD, ED ([facility], [facility], [facility] ONLY)    Imaging Review Ct Head Wo Contrast  06/04/2015   CLINICAL DATA:  44 year old female with history of frontal headache for 4 days. High blood pressure. Photophobia.  EXAM: CT HEAD WITHOUT CONTRAST  TECHNIQUE: Contiguous axial images were obtained from the base of the skull through the vertex without intravenous contrast.  COMPARISON:  No priors.  FINDINGS: No acute intracranial abnormalities. Specifically, no evidence of acute intracranial hemorrhage, no definite findings of acute/subacute cerebral ischemia, no mass, mass effect, hydrocephalus or abnormal intra or extra-axial fluid collections. Visualized paranasal sinuses and mastoids are well pneumatized. No acute displaced skull fractures are identified.  IMPRESSION: *No acute intracranial abnormalities. *The appearance of the brain is normal.   Electronically Signed   By: Trudie Reed M.D.   On: 06/04/2015 08:50   I have personally reviewed and evaluated these images and lab results as part of my medical decision-making.   EKG Interpretation None      MDM   Final diagnoses:  Acute nonintractable headache, unspecified headache type    44 y.o. female with pertinent PMH as above presents with headache, neck pain.  Seen last night for abd pain with negative wu.  States she has never had these symptoms in the past.  On arrival today vitals and physical exam as above.    Wu unremarkable.  Symptoms relieved by reglan, benadryl, decadron.  DC home in stable condition  I have reviewed all laboratory and imaging studies  if ordered as above  1. Acute nonintractable headache, unspecified headache type         Mirian Mo, MD 06/05/15 [phone]

## 2015-06-04 NOTE — ED Notes (Signed)
Pt sts that last Wednesday her pain started in the head and she was unable to move her neck. Pt noticed both eyes started hurting behind her eyes. Pt reports no history of migraines. Pt sts she has has nausea starting on Thursday. Reports blurry vision. Denies dizziness and lightheaded-ness.

## 2015-06-13 ENCOUNTER — Inpatient Hospital Stay: Payer: Self-pay | Admitting: Family Medicine

## 2015-08-22 ENCOUNTER — Emergency Department (HOSPITAL_COMMUNITY)
Admission: EM | Admit: 2015-08-22 | Discharge: 2015-08-22 | Disposition: A | Discharge status: Home / Self Care | Payer: Worker's Compensation | Attending: Emergency Medicine | Admitting: Emergency Medicine

## 2015-08-22 ENCOUNTER — Emergency Department (HOSPITAL_COMMUNITY): Payer: Worker's Compensation

## 2015-08-22 ENCOUNTER — Encounter (HOSPITAL_COMMUNITY): Payer: Self-pay

## 2015-08-22 DIAGNOSIS — Y9389 Activity, other specified: Secondary | ICD-10-CM | POA: Insufficient documentation

## 2015-08-22 DIAGNOSIS — W230XXA Caught, crushed, jammed, or pinched between moving objects, initial encounter: Secondary | ICD-10-CM | POA: Insufficient documentation

## 2015-08-22 DIAGNOSIS — S6992XA Unspecified injury of left wrist, hand and finger(s), initial encounter: Secondary | ICD-10-CM

## 2015-08-22 DIAGNOSIS — Y990 Civilian activity done for income or pay: Secondary | ICD-10-CM | POA: Insufficient documentation

## 2015-08-22 DIAGNOSIS — S60413A Abrasion of left middle finger, initial encounter: Secondary | ICD-10-CM | POA: Insufficient documentation

## 2015-08-22 DIAGNOSIS — Y9289 Other specified places as the place of occurrence of the external cause: Secondary | ICD-10-CM | POA: Insufficient documentation

## 2015-08-22 DIAGNOSIS — F17200 Nicotine dependence, unspecified, uncomplicated: Secondary | ICD-10-CM | POA: Insufficient documentation

## 2015-08-22 NOTE — ED Provider Notes (Signed)
CSN: [id number]     Arrival date & time 08/22/15  0609 History   First MD Initiated Contact with Patient 08/22/15 0615     Chief Complaint  Patient presents with  . Finger Injury     (Consider location/radiation/quality/duration/timing/severity/associated sxs/prior Treatment) HPI Patient presents to the emergency department with a finger injury that occurred while she was a work.  Patient states she caught her finger in a door.  She states that there is bruising and swelling noted to the distal end of her finger, states that she can move it without difficulty.  States initially the pain was significant but has since subsided somewhat.  Patient states she was sent here by her employer for further evaluation.  Patient denies numbness or weakness in the finger History reviewed. No pertinent past medical history. History reviewed. No pertinent past surgical history. No family history on file. Social History  Substance Use Topics  . Smoking status: Current Some Day Smoker  . Smokeless tobacco: Never Used  . Alcohol Use: Yes     Comment: "Occasionally"    OB History    No data available     Review of Systems   All other systems negative except as documented in the HPI. All pertinent positives and negatives as reviewed in the HPI. Allergies  Review of patient's allergies indicates no known allergies.  Home Medications   Prior to Admission medications   Medication Sig Start Date End Date Taking? Authorizing Provider  acetaminophen (TYLENOL) 325 MG tablet Take 650 mg by mouth every 4 (four) hours as needed.    Historical Provider, MD  albuterol (PROVENTIL HFA;VENTOLIN HFA) 108 (90 BASE) MCG/ACT inhaler Inhale 1-2 puffs into the lungs every 6 (six) hours as needed for wheezing or shortness of breath.    Historical Provider, MD  dextromethorphan-guaiFENesin (MUCINEX DM) 30-600 MG per 12 hr tablet Take 1 tablet by mouth 2 (two) times daily as needed for cough.    Historical Provider, MD    guaiFENesin-dextromethorphan (ROBITUSSIN DM) 100-10 MG/5ML syrup Take 15 mLs by mouth every 4 (four) hours as needed for cough.    Historical Provider, MD  naproxen (NAPROSYN) 500 MG tablet Take 1 tablet (500 mg total) by mouth 2 (two) times daily. Patient not taking: Reported on 06/04/2015 06/03/15   Jerelyn Scott, MD   BP 156/118 mmHg  Pulse 85  Temp(Src) 98.9 F (37.2 C) (Oral)  Resp 17  Ht  (1.575 m)  Wt 203 lb (92.08 kg)  BMI 37.12 kg/m2  SpO2 100%  LMP 08/20/2015 Physical Exam  Constitutional: She is oriented to person, place, and time. She appears well-developed and well-nourished.  HENT:  Head: Normocephalic and atraumatic.  Pulmonary/Chest: Effort normal.  Musculoskeletal:       Left hand: She exhibits tenderness and swelling. She exhibits normal range of motion, normal capillary refill, no deformity and no laceration. Normal sensation noted. Normal strength noted.       Hands: Neurological: She is alert and oriented to person, place, and time. She exhibits normal muscle tone. Coordination normal.  Skin: Skin is warm and dry.  Nursing note and vitals reviewed.   ED Course  Procedures (including critical care time)  Imaging Review No results found. I have personally reviewed and evaluated these images and lab results as part of my medical decision-making.  The patient will be treated for contusion and crush injury of the finger.  Told to return here as needed.  Advised to ice and elevate the finger  Charlestine NightChristopher Arihant Pennings, PA-C 08/24/15 2347  Shon Batonourtney F Horton, MD 08/29/15 [phone]

## 2015-08-22 NOTE — Discharge Instructions (Signed)
Tylenol and Motrin for pain.  Return here as needed.  Ice and elevate the finger

## 2015-08-22 NOTE — ED Notes (Signed)
Pt was at work and smashed finer in door, bruising to L middle digit, pt able to move digit freely.

## 2016-09-15 ENCOUNTER — Emergency Department (HOSPITAL_BASED_OUTPATIENT_CLINIC_OR_DEPARTMENT_OTHER)
Admit: 2016-09-15 | Discharge: 2016-09-15 | Disposition: A | Discharge status: Home / Self Care | Payer: Self-pay | Attending: Emergency Medicine | Admitting: Emergency Medicine

## 2016-09-15 ENCOUNTER — Emergency Department (HOSPITAL_COMMUNITY)
Admission: EM | Admit: 2016-09-15 | Discharge: 2016-09-15 | Disposition: A | Discharge status: Home / Self Care | Payer: Self-pay | Attending: Emergency Medicine | Admitting: Emergency Medicine

## 2016-09-15 ENCOUNTER — Encounter (HOSPITAL_COMMUNITY): Payer: Self-pay | Admitting: *Deleted

## 2016-09-15 DIAGNOSIS — F17210 Nicotine dependence, cigarettes, uncomplicated: Secondary | ICD-10-CM | POA: Insufficient documentation

## 2016-09-15 DIAGNOSIS — M7989 Other specified soft tissue disorders: Secondary | ICD-10-CM

## 2016-09-15 DIAGNOSIS — Y939 Activity, unspecified: Secondary | ICD-10-CM | POA: Insufficient documentation

## 2016-09-15 DIAGNOSIS — M79609 Pain in unspecified limb: Secondary | ICD-10-CM

## 2016-09-15 DIAGNOSIS — M660 Rupture of popliteal cyst: Secondary | ICD-10-CM | POA: Insufficient documentation

## 2016-09-15 DIAGNOSIS — Y990 Civilian activity done for income or pay: Secondary | ICD-10-CM | POA: Insufficient documentation

## 2016-09-15 DIAGNOSIS — X501XXA Overexertion from prolonged static or awkward postures, initial encounter: Secondary | ICD-10-CM | POA: Insufficient documentation

## 2016-09-15 DIAGNOSIS — Y929 Unspecified place or not applicable: Secondary | ICD-10-CM | POA: Insufficient documentation

## 2016-09-15 LAB — CBC
HCT: 36.8 % (ref 36.0–46.0)
HEMOGLOBIN: 11.8 g/dL — AB (ref 12.0–15.0)
MCH: 26.2 pg (ref 26.0–34.0)
MCHC: 32.1 g/dL (ref 30.0–36.0)
MCV: 81.8 fL (ref 78.0–100.0)
Platelets: 292 10*3/uL (ref 150–400)
RBC: 4.5 MIL/uL (ref 3.87–5.11)
RDW: 15.2 % (ref 11.5–15.5)
WBC: 9.3 10*3/uL (ref 4.0–10.5)

## 2016-09-15 LAB — BASIC METABOLIC PANEL
Anion gap: 6 (ref 5–15)
BUN: 19 mg/dL (ref 6–20)
CHLORIDE: 105 mmol/L (ref 101–111)
CO2: 28 mmol/L (ref 22–32)
CREATININE: 0.89 mg/dL (ref 0.44–1.00)
Calcium: 9.1 mg/dL (ref 8.9–10.3)
Glucose, Bld: 126 mg/dL — ABNORMAL HIGH (ref 65–99)
POTASSIUM: 3 mmol/L — AB (ref 3.5–5.1)
SODIUM: 139 mmol/L (ref 135–145)

## 2016-09-15 MED ORDER — POTASSIUM CHLORIDE CRYS ER 20 MEQ PO TBCR
40.0000 meq | EXTENDED_RELEASE_TABLET | Freq: Once | ORAL | Status: AC
  Administered 2016-09-15: 40 meq via ORAL
  Filled 2016-09-15: qty 2

## 2016-09-15 MED ORDER — IBUPROFEN 800 MG PO TABS: 800.0000 mg | ORAL_TABLET | Freq: Three times a day (TID) | ORAL | 0 refills | Status: DC

## 2016-09-15 NOTE — ED Triage Notes (Signed)
Left lower leg pain and swelling.  Pt denies any trauma to this leg.  Pain is most behind the knee.  Pt states that she has had these symptoms for about about a month but it started out mild and has been increasing.

## 2016-09-15 NOTE — ED Notes (Signed)
Pt states that the sleeve feels better, and observed pt range of motion on knee with no issues.

## 2016-09-15 NOTE — ED Notes (Signed)
Ambulated pt from waiting room to TR01, waiting for provider.

## 2016-09-15 NOTE — ED Provider Notes (Signed)
[facility]-EMERGENCY DEPT Provider Note   CSN: [id number] Arrival date & time: 09/15/16  1417  By signing my name below, I, Linna DarnerRussell Turner, attest that this documentation has been prepared under the direction and in the presence of Roxy Horsemanobert Saraann Enneking, PA-C. Electronically Signed: Linna Darnerussell Turner, Scribe. 09/15/2016. 4:05 PM.  History   Chief Complaint Chief Complaint  Patient presents with  . Leg Pain    The history is provided by the patient. No language interpreter was used.     Zoe Parker is a 45 y.o. female who presents to the Emergency Department with a chief complaint of constant, worsening, left lower leg pain for about a month. She reports associated swelling. Pt states her pain is most significant in her left calf. She states she stood up at work earlier today and felt a popping sensation in her left lower leg; she reports her pain and swelling worsened severely at this time. No known trauma or injury to her left lower leg. No h/o blood clot. She denies numbness, weakness, color change, wounds, fever, chills, or any other associated symptoms.   History reviewed. No pertinent past medical history.  There are no active problems to display for this patient.   History reviewed. No pertinent surgical history.  OB History    No data available       Home Medications    Prior to Admission medications   Medication Sig Start Date End Date Taking? Authorizing Provider  Ascorbic Acid (VITAMIN C PO) Take 1 tablet by mouth daily.    Historical Provider, MD  ibuprofen (ADVIL,MOTRIN) 800 MG tablet Take 1 tablet (800 mg total) by mouth 3 (three) times daily. 09/15/16   Roxy Horsemanobert Latravia Southgate, PA-C  naproxen (NAPROSYN) 500 MG tablet Take 1 tablet (500 mg total) by mouth 2 (two) times daily. Patient not taking: Reported on 06/04/2015 06/03/15   Jerelyn ScottMartha Linker, MD    Family History No family history on file.  Social History Social History  Substance Use Topics  . Smoking status: Current Every  Day Smoker    Packs/day: 1.00    Types: Cigarettes  . Smokeless tobacco: Never Used  . Alcohol use Yes     Comment: "Occasionally"      Allergies   Patient has no known allergies.   Review of Systems Review of Systems  Constitutional: Negative for chills and fever.  Musculoskeletal: Positive for joint swelling (left lower leg) and myalgias (left lower leg).  Skin: Negative for color change and wound.  Neurological: Negative for weakness and numbness.     Physical Exam Updated Vital Signs BP (!) 181/104   Pulse 88   Temp 98.2 F (36.8 C) (Oral)   Resp 18   Wt 201 lb 11.2 oz (91.5 kg)   SpO2 95%   BMI 36.89 kg/m   Physical Exam  Constitutional: Pt appears well-developed and well-nourished. No distress.  HENT:  Head: Normocephalic and atraumatic.  Eyes: Conjunctivae are normal.  Neck: Normal range of motion.  Cardiovascular: Normal rate, regular rhythm and intact distal pulses.   Capillary refill < 3 sec  Pulmonary/Chest: Effort normal and breath sounds normal.  Musculoskeletal: Pt exhibits tenderness to left popliteal region. Pt exhibits mild edema.  ROM: 4/5 limited by pain  Neurological: Pt  is alert. Coordination normal.  Sensation 5/5 Strength 4/5 limited by pain  Skin: Skin is warm and dry. Pt is not diaphoretic.  No tenting of the skin  Psychiatric: Pt has a normal mood and affect.  Nursing note  and vitals reviewed.  ED Treatments / Results  Labs (all labs ordered are listed, but only abnormal results are displayed) Labs Reviewed  CBC - Abnormal; Notable for the following:       Result Value   Hemoglobin 11.8 (*)    All other components within normal limits  BASIC METABOLIC PANEL - Abnormal; Notable for the following:    Potassium 3.0 (*)    Glucose, Bld 126 (*)    All other components within normal limits    EKG  EKG Interpretation None       Radiology No results found.  Procedures Procedures (including critical care  time)  DIAGNOSTIC STUDIES: Oxygen Saturation is 95% on RA, adequate by my interpretation.    COORDINATION OF CARE: 4:12 PM Discussed treatment plan with pt at bedside and pt agreed to plan.  Medications Ordered in ED Medications  potassium chloride SA (K-DUR,KLOR-CON) CR tablet 40 mEq (not administered)     Initial Impression / Assessment and Plan / ED Course  I have reviewed the triage vital signs and the nursing notes.  Pertinent labs & imaging results that were available during my care of the patient were reviewed by me and considered in my medical decision making (see chart for details).  Clinical Course     Patient with left leg pain and swelling.  DVT study reveals ruptured Baker's cyst, no DVT.  No evidence of abscess or cellulitis.  Final Clinical Impressions(s) / ED Diagnoses   Patient X-Ray negative for obvious fracture or dislocation.  Pt advised to follow up with orthopedics. Patient given knee sleeve while in ED, conservative therapy recommended and discussed. Patient will be discharged home & is agreeable with above plan. Returns precautions discussed. Pt appears safe for discharge.  Final diagnoses:  Baker's cyst, ruptured    New Prescriptions New Prescriptions   IBUPROFEN (ADVIL,MOTRIN) 800 MG TABLET    Take 1 tablet (800 mg total) by mouth 3 (three) times daily.   I personally performed the services described in this documentation, which was scribed in my presence. The recorded information has been reviewed and is accurate.      Roxy Horsemanobert Yandell Mcjunkins, PA-C 09/15/16 1638    Gwyneth SproutWhitney Plunkett, MD 09/16/16 2045

## 2016-09-15 NOTE — ED Notes (Signed)
Applied knee sleeve per Magda PaganiniAudrey Banker(RN)

## 2016-09-15 NOTE — ED Notes (Signed)
Declined W/C at D/C and was escorted to lobby by RN. 

## 2016-09-15 NOTE — Progress Notes (Signed)
VASCULAR LAB PRELIMINARY  PRELIMINARY  PRELIMINARY  PRELIMINARY  Left lower extremity venous duplex completed.    Preliminary report:  There is no DVT or SVT noted in the left lower extremity.  There is a partially ruptured Baker's cyst noted in the left popliteal fossa with fluid in the proximal calf as well as interstitial fluid noted throughout the left calf.  Called report to SheffieldJamie, Charity fundraiserN.  Cyndia Degraff, RVT 09/15/2016, 3:43 PM

## 2020-06-16 ENCOUNTER — Emergency Department (HOSPITAL_COMMUNITY)
Admission: EM | Admit: 2020-06-16 | Discharge: 2020-06-16 | Disposition: A | Discharge status: Home / Self Care | Payer: BC Managed Care – PPO | Attending: Emergency Medicine | Admitting: Emergency Medicine

## 2020-06-16 ENCOUNTER — Emergency Department (HOSPITAL_COMMUNITY): Payer: BC Managed Care – PPO

## 2020-06-16 ENCOUNTER — Encounter (HOSPITAL_COMMUNITY): Payer: Self-pay | Admitting: Emergency Medicine

## 2020-06-16 DIAGNOSIS — F17210 Nicotine dependence, cigarettes, uncomplicated: Secondary | ICD-10-CM | POA: Insufficient documentation

## 2020-06-16 DIAGNOSIS — Z20822 Contact with and (suspected) exposure to covid-19: Secondary | ICD-10-CM | POA: Diagnosis not present

## 2020-06-16 DIAGNOSIS — R0602 Shortness of breath: Secondary | ICD-10-CM | POA: Insufficient documentation

## 2020-06-16 DIAGNOSIS — Z79899 Other long term (current) drug therapy: Secondary | ICD-10-CM | POA: Insufficient documentation

## 2020-06-16 LAB — BASIC METABOLIC PANEL
Anion gap: 12 (ref 5–15)
BUN: 16 mg/dL (ref 6–20)
CO2: 25 mmol/L (ref 22–32)
Calcium: 9.3 mg/dL (ref 8.9–10.3)
Chloride: 104 mmol/L (ref 98–111)
Creatinine, Ser: 0.59 mg/dL (ref 0.44–1.00)
GFR calc Af Amer: >60 mL/min (ref >60)
GFR calc non Af Amer: >60 mL/min (ref >60)
Glucose, Bld: 91 mg/dL (ref 70–99)
Potassium: 3.2 mmol/L — ABNORMAL LOW (ref 3.5–5.1)
Sodium: 141 mmol/L (ref 135–145)

## 2020-06-16 LAB — SARS CORONAVIRUS 2 BY RT PCR (HOSPITAL ORDER, PERFORMED IN ~~LOC~~ HOSPITAL LAB): SARS Coronavirus 2: NEGATIVE

## 2020-06-16 LAB — CBC
HCT: 37.3 % (ref 36.0–46.0)
Hemoglobin: 11.6 g/dL — ABNORMAL LOW (ref 12.0–15.0)
MCH: 26.4 pg (ref 26.0–34.0)
MCHC: 31.1 g/dL (ref 30.0–36.0)
MCV: 85 fL (ref 80.0–100.0)
Platelets: 258 10*3/uL (ref 150–400)
RBC: 4.39 MIL/uL (ref 3.87–5.11)
RDW: 15.1 % (ref 11.5–15.5)
WBC: 8.7 10*3/uL (ref 4.0–10.5)
nRBC: 0 % (ref 0.0–0.2)

## 2020-06-16 LAB — BRAIN NATRIURETIC PEPTIDE: B Natriuretic Peptide: 108.5 pg/mL — ABNORMAL HIGH (ref 0.0–100.0)

## 2020-06-16 LAB — TROPONIN I (HIGH SENSITIVITY)
Troponin I (High Sensitivity): 6 ng/L (ref <18)
Troponin I (High Sensitivity): 4 ng/L (ref <18)

## 2020-06-16 NOTE — ED Provider Notes (Signed)
[facility] EMERGENCY DEPARTMENT Provider Note   CSN: [id number] Arrival date & time: 06/16/20  1019     History No chief complaint on file.   Zoe Parker is a 49 y.o. female.  HPI    Patient presents with concern of shortness of breath. Patient knowledges a long smoking history, currently 1 pack a day, but states that she is otherwise well. 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 Symptoms persisted, were slightly stronger for several hours, but are now improved, though present. No pain, no fever, no nausea, vomiting, diarrhea, syncope. No recent change in medication, diet, activity. 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 Last vaccine dose was earlier this year. No past medical history on file.  There are no problems to display for this patient.   History reviewed. No pertinent surgical history.   OB History   No obstetric history on file.     No family history on file.  Social History   Tobacco Use  . Smoking status: Current Every Day Smoker    Packs/day: 1.00    Types: Cigarettes  . Smokeless tobacco: Never Used  Substance Use Topics  . Alcohol use: Yes    Comment: "Occasionally"   . Drug use: No    Home Medications Prior to Admission medications   Medication Sig Start Date End Date Taking? Authorizing Provider  Ascorbic Acid (VITAMIN C PO) Take 1 tablet by mouth daily.    [provider]  ibuprofen (ADVIL,MOTRIN) 800 MG tablet Take 1 tablet (800 mg total) by mouth 3 (three) times daily. 09/15/16   Roxy Horseman, PA-C  naproxen (NAPROSYN) 500 MG tablet Take 1 tablet (500 mg total) by mouth 2 (two) times daily. Patient not taking: Reported on 06/04/2015 06/03/15   Phillis Haggis, MD    Allergies    Patient has no known allergies.  Review of Systems   Review of Systems  Constitutional:       Per  HPI, otherwise negative  HENT:       Per HPI, otherwise negative  Respiratory:       Per HPI, otherwise negative  Cardiovascular:       Per HPI, otherwise negative  Gastrointestinal: Negative for vomiting.  Endocrine:       Negative aside from HPI  Genitourinary:       Neg aside from HPI   Musculoskeletal:       Bilateral lower extremity swelling over the course of the workday, improved at rest.  Skin: Negative.   Neurological: Negative for syncope.    Physical Exam Updated Vital Signs BP (!) 181/94   Pulse 71   Temp 98.3 F (36.8 C) (Oral)   Resp 15   Ht 5\' 2"  (1.575 m)   Wt 93 kg   SpO2 100%   BMI 37.49 kg/m   Physical Exam Vitals and nursing note reviewed.  Constitutional:      General: She is not in acute distress.    Appearance: She is well-developed.  HENT:     Head: Normocephalic and atraumatic.  Eyes:     Conjunctiva/sclera: Conjunctivae normal.  Cardiovascular:     Rate and Rhythm: Normal rate and regular rhythm.  Pulmonary:     Effort: Pulmonary effort is normal. No respiratory distress.     Breath sounds: Normal breath sounds. No stridor.  Abdominal:     General: There is no distension.  Skin:    General: Skin is warm and dry.  Neurological:     Mental Status: She is alert and oriented to person, place, and time.     Cranial Nerves: No cranial nerve deficit.      ED Results / Procedures / Treatments   Labs (all labs ordered are listed, but only abnormal results are displayed) Labs Reviewed  BASIC METABOLIC PANEL - Abnormal; Notable for the following components:      Result Value   Potassium 3.2 (*)    All other components within normal limits  CBC - Abnormal; Notable for the following components:   Hemoglobin 11.6 (*)    All other components within normal limits  BRAIN NATRIURETIC PEPTIDE - Abnormal; Notable for the following components:   B Natriuretic Peptide 108.5 (*)    All other components within normal limits  SARS CORONAVIRUS 2  BY RT PCR (HOSPITAL ORDER, PERFORMED IN [facility])  I-STAT BETA HCG BLOOD, ED ([facility], [facility], [facility] ONLY)  TROPONIN I (HIGH SENSITIVITY)  TROPONIN I (HIGH SENSITIVITY)    EKG EKG Interpretation  Date/Time:  Thursday June 16 2020 10:39:24 EDT Ventricular Rate:  76 PR Interval:  240 QRS Duration: 80 QT Interval:  424 QTC Calculation: 477 R Axis:   -53 Text Interpretation: Sinus rhythm with 1st degree A-V block Left axis deviation Cannot rule out Anterior infarct , age undetermined Abnormal ECG Confirmed by Gerhard Munch [phone]) on 06/16/2020 10:15:58 PM   Radiology DG Chest 2 View  Result Date: 06/16/2020 CLINICAL DATA:  Shortness of breath EXAM: CHEST - 2 VIEW COMPARISON:  06/22/2018 FINDINGS: Mild scarring or atelectasis in the bilateral mid chest. Borderline heart size on the frontal view but normal appearing on the lateral view. There is no edema, consolidation, effusion, or pneumothorax. IMPRESSION: Mild atelectasis or scarring.  No edema or consolidation. Electronically Signed   By: Marnee Spring M.D.   On: 06/16/2020 11:12    Procedures Procedures (including critical care time)  Medications Ordered in ED Medications - No data to display  ED Course  I have reviewed the triage vital signs and the nursing notes.  Pertinent labs & imaging results that were available during my care of the patient were reviewed by me and considered in my medical decision making (see chart for details).   11:12 PM Patient in no distress, awake, alert.  She has taken a nap, notes that she feels substantially better. We reviewed all findings including reassuring two normal troponin, nonischemic EKG, and with no ongoing symptoms, patient is appropriate for discharge. Notably, patient does have slight elevation in BNP and given her history of lower extremity swelling, will follow up outpatient with our cardiology colleagues for consideration of echocardiogram.  Final Clinical  Impression(s) / ED Diagnoses Final diagnoses:  SOB (shortness of breath)    MDM Number of Diagnoses or Management Options SOB (shortness of breath): new, needed workup   Amount and/or Complexity of Data Reviewed Clinical lab tests: reviewed Tests in the radiology section of CPT: reviewed Tests in the medicine section of CPT: reviewed Review and summarize past medical records: yes Independent visualization of images, tracings, or specimens: yes  Risk of Complications, Morbidity, and/or Mortality Presenting problems: high Diagnostic procedures: high Management options: high  Critical Care Total time providing critical care: < 30 minutes  Patient Progress Patient progress: stable    Gerhard Munch, MD 06/16/20 2327

## 2020-06-16 NOTE — ED Triage Notes (Signed)
Pt here from work with c/o some slight sob and fluttering in her chest . Pt does work 3 rd shift smokes and drinks a decent amount of caffeine

## 2020-06-16 NOTE — ED Notes (Signed)
Ambulated pt in WR. Initial SPO2 was 99% and went down to 97%. No complaints of SOB or dizziness. Pt was steady on feet and ambulated with no assistance.

## 2020-06-16 NOTE — Discharge Instructions (Addendum)
As discussed, your evaluation today has been largely reassuring.  But, it is important that you monitor your condition carefully, and do not hesitate to return to the ED if you develop new, or concerning changes in your condition.  Otherwise, please follow-up with our cardiology colleagues for consideration of echocardiogram given your lower extremity swelling.

## 2020-07-07 ENCOUNTER — Ambulatory Visit: Payer: BC Managed Care – PPO | Admitting: Internal Medicine

## 2020-07-07 ENCOUNTER — Telehealth: Payer: Self-pay | Admitting: *Deleted

## 2020-07-07 NOTE — Telephone Encounter (Signed)
LVM to contact clinic back due to provider schedule change

## 2020-07-28 ENCOUNTER — Ambulatory Visit: Payer: BC Managed Care – PPO | Admitting: Internal Medicine

## 2020-07-28 NOTE — Progress Notes (Deleted)
Cardiology Office Note:    Date:  07/28/2020   ID:  Zoe Parker, DOB August 01, 1971, MRN [id number]  PCP:  Patient, No Pcp Per  [facility] Cardiologist:  No primary care provider on file.  [facility] Electrophysiologist:  None   CC: Consulted for the evaluation of  at the behest of Dr. Gerhard Munch   History of Present Illness:    Zoe Parker is a 49 y.o. female with a hx of Tobacco Abuse, HTN Urgency  With recent ED Eval 06/06/20  No past medical history on file.  No past surgical history on file.  Current Medications: No outpatient medications have been marked as taking for the 07/28/20 encounter (Appointment) with Christell Constant, MD.     Allergies:   Patient has no known allergies.   Social History   Socioeconomic History  . Marital status: Single    Spouse name: Not on file  . Number of children: Not on file  . Years of education: Not on file  . Highest education level: Not on file  Occupational History  . Not on file  Tobacco Use  . Smoking status: Current Every Day Smoker    Packs/day: 1.00    Types: Cigarettes  . Smokeless tobacco: Never Used  Substance and Sexual Activity  . Alcohol use: Yes    Comment: "Occasionally"   . Drug use: No  . Sexual activity: Not on file  Other Topics Concern  . Not on file  Social History Narrative  . Not on file   Social Determinants of Health   Financial Resource Strain:   . Difficulty of Paying Living Expenses: Not on file  Food Insecurity:   . Worried About Programme researcher, broadcasting/film/video in the Last Year: Not on file  . Ran Out of Food in the Last Year: Not on file  Transportation Needs:   . Lack of Transportation (Medical): Not on file  . Lack of Transportation (Non-Medical): Not on file  Physical Activity:   . Days of Exercise per Week: Not on file  . Minutes of Exercise per Session: Not on file  Stress:   . Feeling of Stress : Not on file  Social Connections:   . Frequency of Communication with  Friends and Family: Not on file  . Frequency of Social Gatherings with Friends and Family: Not on file  . Attends Religious Services: Not on file  . Active Member of Clubs or Organizations: Not on file  . Attends Banker Meetings: Not on file  . Marital Status: Not on file     Family History: The patient's ***family history is not on file.  ROS:   Please see the history of present illness.    *** All other systems reviewed and are negative.  EKGs/Labs/Other Studies Reviewed:    The following studies were reviewed today:  EKG:   9/16/21Sinus rhythm with 1st HB, LAE LAD, poor R wave progression  Recent Labs: 06/16/2020: B Natriuretic Peptide 108.5; BUN 16; Creatinine, Ser 0.59; Hemoglobin 11.6; Platelets 258; Potassium 3.2; Sodium 141    Physical Exam:    VS:  There were no vitals taken for this visit.    Wt Readings from Last 3 Encounters:  06/16/20 205 lb (93 kg)  09/15/16 201 lb 11.2 oz (91.5 kg)  08/22/15 203 lb (92.1 kg)     GEN: *** Well nourished, well developed in no acute distress HEENT: Normal NECK: No JVD; No carotid bruits LYMPHATICS: No lymphadenopathy CARDIAC: ***  RRR, no murmurs, rubs, gallops RESPIRATORY:  Clear to auscultation without rales, wheezing or rhonchi  ABDOMEN: Soft, non-tender, non-distended MUSCULOSKELETAL:  No edema; No deformity  SKIN: Warm and dry NEUROLOGIC:  Alert and oriented x 3 PSYCHIATRIC:  Normal affect   ASSESSMENT:    No diagnosis found. PLAN:    In order of problems listed above:  1. ***   Medication Adjustments/Labs and Tests Ordered: Current medicines are reviewed at length with the patient today.  Concerns regarding medicines are outlined above.  No orders of the defined types were placed in this encounter.  No orders of the defined types were placed in this encounter.   There are no Patient Instructions on file for this visit.   Signed, Christell Constant, MD  07/28/2020 12:46 PM    [facility]

## 2020-08-12 ENCOUNTER — Encounter: Payer: Self-pay | Admitting: General Practice

## 2021-09-18 ENCOUNTER — Encounter (HOSPITAL_COMMUNITY): Payer: Self-pay

## 2021-09-18 ENCOUNTER — Ambulatory Visit (HOSPITAL_COMMUNITY)
Admission: EM | Admit: 2021-09-18 | Discharge: 2021-09-18 | Disposition: A | Discharge status: Home / Self Care | Payer: BC Managed Care – PPO | Attending: Urgent Care | Admitting: Urgent Care

## 2021-09-18 DIAGNOSIS — I10 Essential (primary) hypertension: Secondary | ICD-10-CM | POA: Diagnosis not present

## 2021-09-18 DIAGNOSIS — F439 Reaction to severe stress, unspecified: Secondary | ICD-10-CM | POA: Diagnosis not present

## 2021-09-18 LAB — COMPREHENSIVE METABOLIC PANEL
ALT: 22 U/L (ref 0–44)
AST: 23 U/L (ref 15–41)
Albumin: 3.9 g/dL (ref 3.5–5.0)
Alkaline Phosphatase: 67 U/L (ref 38–126)
Anion gap: 8 (ref 5–15)
BUN: 11 mg/dL (ref 6–20)
CO2: 27 mmol/L (ref 22–32)
Calcium: 9.2 mg/dL (ref 8.9–10.3)
Chloride: 105 mmol/L (ref 98–111)
Creatinine, Ser: 0.6 mg/dL (ref 0.44–1.00)
GFR, Estimated: >60 mL/min (ref >60)
Glucose, Bld: 90 mg/dL (ref 70–99)
Potassium: 3.8 mmol/L (ref 3.5–5.1)
Sodium: 140 mmol/L (ref 135–145)
Total Bilirubin: 0.6 mg/dL (ref 0.3–1.2)
Total Protein: 6.9 g/dL (ref 6.5–8.1)

## 2021-09-18 LAB — CBC
HCT: 39.8 % (ref 36.0–46.0)
Hemoglobin: 12.8 g/dL (ref 12.0–15.0)
MCH: 26.7 pg (ref 26.0–34.0)
MCHC: 32.2 g/dL (ref 30.0–36.0)
MCV: 83.1 fL (ref 80.0–100.0)
Platelets: 287 10*3/uL (ref 150–400)
RBC: 4.79 MIL/uL (ref 3.87–5.11)
RDW: 15 % (ref 11.5–15.5)
WBC: 5.5 10*3/uL (ref 4.0–10.5)
nRBC: 0 % (ref 0.0–0.2)

## 2021-09-18 MED ORDER — AMLODIPINE BESYLATE 5 MG PO TABS: 5.0000 mg | ORAL_TABLET | Freq: Every day | ORAL | 0 refills | Status: DC

## 2021-09-18 NOTE — ED Provider Notes (Signed)
[facility]    CSN: [id number] Arrival date & time: 09/18/21  0802      History   Chief Complaint Chief Complaint  Patient presents with   Hypertension    HPI Zoe Parker is a 50 y.o. female.   Pleasant 50 year old female presents today with concerns regarding her ears.  She states that "for as long as I can remember "her ears feel pressured.  She reports a pulsatile discomfort.  She states her dentist sent her here for evaluation of her blood pressure.  Hoping to have dental extractions performed at the end of January, but had a blood pressure elevated at the dental visit 4 days ago.  She does not recall the systolic but reports her diastolic was 125.  She does not have a PCP but admits to having elevated blood pressure for many years.  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  She states she has intermittent headaches, and has had mild twinges in her chest, but denies any of those symptoms today.  She denies any acute visual changes, although she does admit that her eyes are "changing due to turning 50".  Patient was seen in the ER greater than a year ago and had a mild anemia and hypokalemia, patient did not follow-up with a primary or have this rechecked.  She denies any additional complaints in office today.   Hypertension Pertinent negatives include no chest pain, no headaches and no shortness of breath.   History reviewed. No pertinent past medical history.  There are no problems to display for this patient.   History reviewed. No pertinent surgical history.  OB History   No obstetric history on file.      Home Medications    Prior to Admission medications   Medication Sig Start Date End Date Taking? Authorizing Provider  amLODipine (NORVASC) 5 MG tablet Take 1 tablet (5 mg total) by mouth daily. 09/18/21 10/18/21 Yes Bradey Luzier L, PA  amoxicillin (AMOXIL) 500 MG tablet Take 500 mg by mouth 3 (three) times daily. 09/14/21  Yes  [provider]  Ascorbic Acid (VITAMIN C PO) Take 1 tablet by mouth daily.   Yes [provider]    Family History History reviewed. No pertinent family history.  Social History Social History   Tobacco Use   Smoking status: Every Day    Packs/day: 1.00    Types: Cigarettes   Smokeless tobacco: Never  Vaping Use   Vaping Use: Some days  Substance Use Topics   Alcohol use: Yes    Comment: "Occasionally"    Drug use: No     Allergies   Patient has no known allergies.   Review of Systems Review of Systems  Constitutional:  Negative for appetite change, diaphoresis, fatigue and fever.  HENT:  Positive for dental problem and ear pain. Negative for congestion, drooling, ear discharge, hearing loss, nosebleeds, postnasal drip, rhinorrhea, sinus pain, sore throat and tinnitus.   Eyes:  Negative for pain.  Respiratory:  Negative for cough, chest tightness and shortness of breath.   Cardiovascular:  Negative for chest pain and palpitations.  Musculoskeletal:  Positive for arthralgias (chronic per pt).  Neurological:  Negative for dizziness, syncope, speech difficulty, weakness, light-headedness and headaches.  Psychiatric/Behavioral:  The patient is nervous/anxious.     Physical Exam Triage Vital Signs ED Triage Vitals  Enc Vitals Group     BP 09/18/21 0830 (!) 185/117     Pulse Rate 09/18/21  0830 84     Resp 09/18/21 0830 18     Temp 09/18/21 0830 98.8 F (37.1 C)     Temp Source 09/18/21 0830 Oral     SpO2 09/18/21 0830 99 %     Weight --      Height --      Head Circumference --      Peak Flow --      Pain Score 09/18/21 0834 0     Pain Loc --      Pain Edu? --      Excl. in GC? --    No data found.  Updated Vital Signs BP (!) 185/117 (BP Location: Left Arm)    Pulse 84    Temp 98.8 F (37.1 C) (Oral)    Resp 18    SpO2 99%   Visual Acuity Right Eye Distance:   Left Eye Distance:   Bilateral Distance:    Right Eye Near:   Left Eye  Near:    Bilateral Near:     Physical Exam Vitals and nursing note reviewed.  Constitutional:      General: She is not in acute distress.    Appearance: She is well-developed. She is obese. She is not ill-appearing, toxic-appearing or diaphoretic.  HENT:     Head: Normocephalic and atraumatic.     Right Ear: Tympanic membrane, ear canal and external ear normal. There is no impacted cerumen.     Left Ear: Tympanic membrane, ear canal and external ear normal. There is no impacted cerumen.     Nose: Nose normal. No congestion or rhinorrhea.     Mouth/Throat:     Mouth: Mucous membranes are moist.     Pharynx: Oropharynx is clear. No oropharyngeal exudate or posterior oropharyngeal erythema.  Eyes:     General: No scleral icterus.       Right eye: No discharge.        Left eye: No discharge.     Extraocular Movements: Extraocular movements intact.     Conjunctiva/sclera: Conjunctivae normal.     Pupils: Pupils are equal, round, and reactive to light.  Neck:     Vascular: No carotid bruit.  Cardiovascular:     Rate and Rhythm: Normal rate and regular rhythm.     Pulses: Normal pulses.     Heart sounds: Normal heart sounds. No murmur heard.   No friction rub. No gallop.  Pulmonary:     Effort: Pulmonary effort is normal. No respiratory distress.     Breath sounds: Normal breath sounds. No wheezing or rhonchi.  Chest:     Chest wall: No tenderness.  Abdominal:     Palpations: Abdomen is soft.     Tenderness: There is no abdominal tenderness.  Musculoskeletal:        General: No swelling.     Cervical back: Normal range of motion and neck supple. No rigidity.     Right lower leg: No edema.     Left lower leg: No edema.  Lymphadenopathy:     Cervical: No cervical adenopathy.  Skin:    General: Skin is warm and dry.     Capillary Refill: Capillary refill takes less than 2 seconds.  Neurological:     General: No focal deficit present.     Mental Status: She is alert and  oriented to person, place, and time. Mental status is at baseline.     Cranial Nerves: No cranial nerve deficit.     Motor:  No weakness.     Gait: Gait normal.  Psychiatric:        Mood and Affect: Mood normal.     UC Treatments / Results  Labs (all labs ordered are listed, but only abnormal results are displayed) Labs Reviewed  COMPREHENSIVE METABOLIC PANEL  CBC    EKG   Radiology No results found.  Procedures Procedures (including critical care time)  Medications Ordered in UC Medications - No data to display  Initial Impression / Assessment and Plan / UC Course  I have reviewed the triage vital signs and the nursing notes.  Pertinent labs & imaging results that were available during my care of the patient were reviewed by me and considered in my medical decision making (see chart for details).     Hypertension -patient without physical exam findings that warrant ER work-up.  Long discussion regarding diet, healthy life choices, weight loss, medications.  We will start Norvasc 5 mg, must start monitoring blood pressure, goal 120/80.  PA assistance placed, will need to follow-up and have this managed. Stress -find ways to cope with stress, improve sleep, meditate.  Follow-up with PCP, have them consider addition of buspirone pending response to Norvasc. Hypokalemia -noted on ER visit in 2021.  Was 3.2, recheck today Anemia -noted in hospital, recheck today. follow-up with PCP for monitoring. patient is no longer menstruating  Final Clinical Impressions(s) / UC Diagnoses   Final diagnoses:  Primary hypertension  Stress     Discharge Instructions      Take your blood pressure medication daily, first thing in the morning Monitor you blood pressure at home with goal readings closer to 120/80 Cut back on salt in your diet.  Find ways to de-stress. Establish care with a primary care physician to monitor BP. Consider discussing buspirone for your stress  levels.    ED Prescriptions     Medication Sig Dispense Auth. Provider   amLODipine (NORVASC) 5 MG tablet Take 1 tablet (5 mg total) by mouth daily. 30 tablet Renella Steig L, [place]      PDMP not reviewed this encounter.   Maretta Bees, [place] 09/18/21 [phone]

## 2021-09-18 NOTE — ED Triage Notes (Signed)
Pt states that she went to the dentist for routine dental care and was told she had high blood pressure. Pt states that she has been told that twice now by the dentist. Pt also states she has had ear pain intermittent for 3 years. Pt states that at times she hears a pulse in her ears.

## 2021-09-18 NOTE — Discharge Instructions (Addendum)
Take your blood pressure medication daily, first thing in the morning Monitor you blood pressure at home with goal readings closer to 120/80 Cut back on salt in your diet.  Find ways to de-stress. Establish care with a primary care physician to monitor BP. Consider discussing buspirone for your stress levels.

## 2021-10-18 ENCOUNTER — Other Ambulatory Visit: Payer: Self-pay

## 2021-10-18 ENCOUNTER — Ambulatory Visit: Payer: BC Managed Care – PPO | Admitting: Student

## 2021-10-18 ENCOUNTER — Encounter: Payer: Self-pay | Admitting: Student

## 2021-10-18 VITALS — BP 141/78 | HR 72 | Temp 97.5°F | Ht 62.0 in | Wt 208.8 lb

## 2021-10-18 DIAGNOSIS — Z6838 Body mass index (BMI) 38.0-38.9, adult: Secondary | ICD-10-CM

## 2021-10-18 DIAGNOSIS — I10 Essential (primary) hypertension: Secondary | ICD-10-CM | POA: Diagnosis not present

## 2021-10-18 DIAGNOSIS — F32A Depression, unspecified: Secondary | ICD-10-CM

## 2021-10-18 DIAGNOSIS — E669 Obesity, unspecified: Secondary | ICD-10-CM

## 2021-10-18 DIAGNOSIS — Z1159 Encounter for screening for other viral diseases: Secondary | ICD-10-CM

## 2021-10-18 DIAGNOSIS — R7303 Prediabetes: Secondary | ICD-10-CM | POA: Diagnosis not present

## 2021-10-18 DIAGNOSIS — F17210 Nicotine dependence, cigarettes, uncomplicated: Secondary | ICD-10-CM | POA: Diagnosis not present

## 2021-10-18 DIAGNOSIS — F331 Major depressive disorder, recurrent, moderate: Secondary | ICD-10-CM

## 2021-10-18 DIAGNOSIS — E66812 Obesity, class 2: Secondary | ICD-10-CM | POA: Insufficient documentation

## 2021-10-18 DIAGNOSIS — Z23 Encounter for immunization: Secondary | ICD-10-CM | POA: Diagnosis not present

## 2021-10-18 DIAGNOSIS — E6609 Other obesity due to excess calories: Secondary | ICD-10-CM | POA: Diagnosis not present

## 2021-10-18 DIAGNOSIS — Z0000 Encounter for general adult medical examination without abnormal findings: Secondary | ICD-10-CM | POA: Insufficient documentation

## 2021-10-18 DIAGNOSIS — F17200 Nicotine dependence, unspecified, uncomplicated: Secondary | ICD-10-CM | POA: Diagnosis not present

## 2021-10-18 LAB — POCT GLYCOSYLATED HEMOGLOBIN (HGB A1C): Hemoglobin A1C: 6.1 % — AB (ref 4.0–5.6)

## 2021-10-18 LAB — GLUCOSE, CAPILLARY: Glucose-Capillary: 134 mg/dL — ABNORMAL HIGH (ref 70–99)

## 2021-10-18 MED ORDER — NICOTINE POLACRILEX 2 MG MT GUM: 2.0000 mg | CHEWING_GUM | OROMUCOSAL | 0 refills | Status: AC | PRN

## 2021-10-18 MED ORDER — HYDROCHLOROTHIAZIDE 12.5 MG PO TABS: 12.5000 mg | ORAL_TABLET | Freq: Every day | ORAL | 2 refills | Status: DC

## 2021-10-18 MED ORDER — NICOTINE 14 MG/24HR TD PT24: 14.0000 mg | MEDICATED_PATCH | TRANSDERMAL | 1 refills | Status: AC

## 2021-10-18 MED ORDER — SERTRALINE HCL 50 MG PO TABS: 50.0000 mg | ORAL_TABLET | Freq: Every day | ORAL | 3 refills | Status: DC

## 2021-10-18 MED ORDER — AMLODIPINE BESYLATE 5 MG PO TABS: 5.0000 mg | ORAL_TABLET | Freq: Every day | ORAL | 0 refills | Status: DC

## 2021-10-18 NOTE — Progress Notes (Signed)
° °  CC: Establish care   HPI:  Zoe Parker is a 51 y.o. F with pmh per below who presents to establish care with our clinic after being found to have elevated blood pressure at a dental visit that required her to go to urgent care.   Past Medical History:  Diagnosis Date   Depression    Hypertension    Menopause    LMP about 2 years ago. No bleeding since.   Tobacco use disorder    Review of Systems:  Please see problem based charting under encounters tab for further details.    Physical Exam:  Vitals:   10/18/21 1114 10/18/21 1126  BP: (!) 158/84 (!) 141/78  Pulse: 79 72  Temp: (!) 97.5 F (36.4 C)   TempSrc: Oral   SpO2: 100%   Weight: 208 lb 12.8 oz (94.7 kg)   Height: 5\' 2"  (1.575 m)    Constitutional: well-developed, well-nourished, and in no distress.  HENT:  Head: Normocephalic and atraumatic.  Eyes: EOM are normal.  Neck: Normal range of motion.  Cardiovascular: Normal rate, regular rhythm, normal heart sounds and intact distal pulses. Exam reveals no gallop and no friction rub.  No murmur heard. Pulmonary: Effort normal and breath sounds normal. No respiratory distress Abdominal: Soft. Bowel sounds are normal. Non distended, non tender.  Musculoskeletal: Normal range of motion.        General: No tenderness or edema.  Neurological: alert and oriented to person, place, and time. Non focal  Skin: Skin is warm and dry.    Assessment & Plan:   See Encounters Tab for problem based charting.  Patient discussed with Dr. 

## 2021-10-18 NOTE — Assessment & Plan Note (Addendum)
Patient is currently on amlodipine 5 mg this was started back in December when she presented to urgent care for elevated blood pressure.  Her blood pressure today is 141/78.  She states at home her systolic blood pressures run between 130 and 140s.  Discussed smoking cessation with patient as well and how this affects her blood pressure.  Patient's blood pressure remains uncontrolled on single antihypertensive agent. -We will add hydrochlorothiazide 12 and half milligrams once daily the patient will follow-up in 2 weeks for a BMP, previous BMP in 08/2021, electrolytes and kidney function WNL.

## 2021-10-18 NOTE — Patient Instructions (Addendum)
Pleasure meeting you.   For your blood pressure we will continue amlodipine 5mg  and add hydrochlorothiazide 12.5mg  daily. We will need you to follow up with in 2 weeks to check your labs.   Regarding your smoking, this could also be contributing to your high blood pressure. Please use the patches and gum like we described. If you feel you need something additional we can discuss that at your next appointment.   Then please schedule an appointment in 3 months to follow up on your chronic issues.   We will send referrals for colonoscopy and mammogram. We can perform your pap smear at the 3 month visit.   Please give [place] a call if you have any issues or concerns.

## 2021-10-18 NOTE — Assessment & Plan Note (Signed)
A1c 6.1 this clinic visit.  Discussed with patient the importance of increased physical activity. Will discuss possibly starting metformin at next clinic visit.

## 2021-10-18 NOTE — Assessment & Plan Note (Addendum)
Patient reports that in the distant past she had hospitalization for a depressive episode where she also described having suicidal thoughts.  She states that she was started on Zoloft and this seemed to help her symptoms.  Today she reports that she is feeling depressed, her PHQ-9 is 11.  She states that she has also noticed that she started smoking more which usually happens when she is depressed.  She denies any suicidal or homicidal thoughts. Assessment and plan: Patient is interested in resuming Zoloft at this time.  We will start Zoloft 50 mg daily -Send referral for Dr. Theodis Shove

## 2021-10-18 NOTE — Assessment & Plan Note (Addendum)
Patient given flu and Tdap.  She has not had a colonoscopy, mammogram, Pap smear in quite some time.  Referral was sent for colonoscopy and a mammogram was ordered.  Patient will schedule appointment in 4 weeks and a Pap smear will be performed at that time.  Hepatitis C was also obtained.

## 2021-10-18 NOTE — Assessment & Plan Note (Signed)
Patient interested in quitting.  She would like to try nicotine patches and gum at this time.  Discussed with patient that we could consider medications to help with smoking cessation if she finds that patches and gum are ineffective.

## 2021-10-18 NOTE — Assessment & Plan Note (Signed)
Patient with BMI of 38.19.  Discussed with patient the importance of weight loss.  She states she noticed that when her weight was lowered that her blood pressure was better controlled. We discussed the importance of increasing her physical activity.  -Will check A1c this clinic visit.

## 2021-10-19 ENCOUNTER — Encounter: Payer: Self-pay | Admitting: Student

## 2021-10-19 ENCOUNTER — Encounter: Payer: Self-pay | Admitting: *Deleted

## 2021-10-19 NOTE — Progress Notes (Signed)
Internal Medicine Clinic Attending  Case discussed with Dr. Carter  At the time of the visit.  We reviewed the resident's history and exam and pertinent patient test results.  I agree with the assessment, diagnosis, and plan of care documented in the resident's note.  

## 2021-10-19 NOTE — Telephone Encounter (Signed)
Pt's message to front desk:  Appointment Request From: Zoe Parker   With Provider: Rick Duff, MD [facility]]   Preferred Date Range: Any date 11/03/2021 or later   Preferred Times: Any Time   Reason for visit: Office Visit   Comments: Reaction to medication. What steps to take on not becoming a full diabetic  I called pt for more details - no answer; I left a message on her self-identified vm to call the office. I also sent her a My chart message to call the office.

## 2021-10-20 DIAGNOSIS — K090 Developmental odontogenic cysts: Secondary | ICD-10-CM | POA: Diagnosis not present

## 2021-10-25 ENCOUNTER — Encounter: Payer: Self-pay | Admitting: Student

## 2021-10-27 ENCOUNTER — Telehealth: Payer: Self-pay

## 2021-10-27 ENCOUNTER — Other Ambulatory Visit: Payer: Self-pay | Admitting: Student

## 2021-10-27 DIAGNOSIS — Z1231 Encounter for screening mammogram for malignant neoplasm of breast: Secondary | ICD-10-CM

## 2021-10-27 NOTE — Telephone Encounter (Signed)
Pt states amLODipine (NORVASC) 5 MG tablet is not at the pharmacy. Pt would like this medication to be sent to [place], [address] AT [address].

## 2021-10-27 NOTE — Telephone Encounter (Signed)
Amlodipine rx sent to [facility] on 10/18/2021  Per pt request-rx should go to [facility] [address] Wende Crease contacted [facility] to have rx transferred   Pt aware.Kingsley Spittle Cassady1/27/202311:37 AM

## 2021-11-09 ENCOUNTER — Ambulatory Visit: Payer: BC Managed Care – PPO | Admitting: Internal Medicine

## 2021-11-09 ENCOUNTER — Encounter: Payer: Self-pay | Admitting: Internal Medicine

## 2021-11-09 ENCOUNTER — Other Ambulatory Visit: Payer: Self-pay

## 2021-11-09 VITALS — BP 160/87 | HR 68 | Temp 98.9°F | Resp 24 | Ht 62.0 in | Wt 203.1 lb

## 2021-11-09 DIAGNOSIS — I10 Essential (primary) hypertension: Secondary | ICD-10-CM

## 2021-11-09 DIAGNOSIS — F17200 Nicotine dependence, unspecified, uncomplicated: Secondary | ICD-10-CM | POA: Diagnosis not present

## 2021-11-09 DIAGNOSIS — F32A Depression, unspecified: Secondary | ICD-10-CM

## 2021-11-09 DIAGNOSIS — Z0000 Encounter for general adult medical examination without abnormal findings: Secondary | ICD-10-CM

## 2021-11-09 MED ORDER — AMLODIPINE BESYLATE 10 MG PO TABS: 10.0000 mg | ORAL_TABLET | Freq: Every day | ORAL | 3 refills | Status: DC

## 2021-11-09 MED ORDER — HYDROCHLOROTHIAZIDE 25 MG PO TABS: 25.0000 mg | ORAL_TABLET | Freq: Every day | ORAL | 3 refills | Status: DC

## 2021-11-09 NOTE — Assessment & Plan Note (Signed)
Patient is following up for hypertension.  She was previously seen on 1/18 and noted to have elevated blood pressure with systolic in the 140s on amlodipine 5 mg daily.  Patient was started on hydrochlorothiazide 12.5 mg daily.  Patient reports medication compliance and notes that overall she has been feeling better.  Denies any headaches, vision changes, chest pain or focal deficits.  Blood pressure at this visit remains elevated with systolic 150s.  She has not been able to check her blood pressure at home.  Assessment/plan: Blood pressure remains above goal -Increase amlodipine to 10 mg, HCTZ to 25 mg -BMP today -Encouraged home BP monitoring -Follow-up in 2 weeks for BP check and medication adjustment

## 2021-11-09 NOTE — Assessment & Plan Note (Signed)
Patient was previously counseled on smoking cessation and prescribed nicotine patches and gum.  She reports that she is quit smoking as of 10/20/2021.  She reports doing well overall.  Not currently interested in pharmacotherapy.  Plan Continue to encourage smoking cessation

## 2021-11-09 NOTE — Patient Instructions (Addendum)
[name],  It was a pleasure seeing you in clinic. Today we discussed:   Blood pressure:  At this time, please increase your amlodipine to 10mg  daily and hydrochlorothiazide (HCTZ) to 25mg  daily. Please monitor your blood pressures at home daily and bring the blood pressure log to your next office visit.   I am checking on some lab work today. I will call you with any abnormal results. Your mammogram is scheduled for 2/17. We will plan for pap smear at your next visit in 2 weeks.  Great job on the smoking cessation! Keep it up! Please let know if we can be of any assistance - there are pharmacologic options to assist with smoking cessation if you need it.   Continue all other medications as prescribed.  If you have any questions or concerns, please call our clinic at [phone] between 9am-5pm and after hours call [phone] and ask for the internal medicine resident on call. If you feel you are having a medical emergency please call 911.   Thank you, we look forward to helping you remain healthy!

## 2021-11-09 NOTE — Progress Notes (Signed)
° °  CC: hypertension follow up  HPI:  Ms.Zoe Parker is a 51 y.o. female with PMHx as stated below presenting for follow up of her hypertension. No acute concerns at this visit. Reports feeling well with improved energy levels. Has stopped smoking since 1/20. Please see problem based charting for complete assessment and plan.   Past Medical History:  Diagnosis Date   Depression    Hypertension    Menopause    LMP about 2 years ago. No bleeding since.   Tobacco use disorder    Review of Systems:  Negative except as stated in HPI.  Physical Exam:  Vitals:   11/09/21 1011 11/09/21 1019  BP: (!) 151/89 (!) 160/87  Pulse: 70 68  Resp: (!) 24   Temp: 98.9 F (37.2 C)   TempSrc: Oral   SpO2: 100%   Weight: 203 lb 1.6 oz (92.1 kg)   Height: 5\' 2"  (1.575 m)    Physical Exam  Constitutional: Appears well-developed and well-nourished. No distress.  Cardiovascular: Normal rate, regular rhythm, S1 and S2 present, no murmurs, rubs, gallops.  Distal pulses intact Respiratory:  Lungs are clear to auscultation bilaterally. Musculoskeletal: Normal bulk and tone.  No peripheral edema noted. Skin: Warm and dry.  No rash, erythema, lesions noted. Psychiatric: Normal mood and affect.    Assessment & Plan:   See Encounters Tab for problem based charting.  Patient discussed with Dr. Evette Doffing

## 2021-11-09 NOTE — Assessment & Plan Note (Addendum)
Patient with prior history of depression was started on Zoloft 50 mg daily at her prior visit.  She reports significant proved in her mood and energy since this.  She is in good spirits.  Denies any acute concerns at this time.  Plan -Continue Zoloft 50 mg daily

## 2021-11-09 NOTE — Assessment & Plan Note (Signed)
Patient defers Pap smear to next visit 

## 2021-11-10 NOTE — Progress Notes (Signed)
Internal Medicine Clinic Attending ° °Case discussed with Dr. Aslam  At the time of the visit.  We reviewed the resident’s history and exam and pertinent patient test results.  I agree with the assessment, diagnosis, and plan of care documented in the resident’s note.  °

## 2021-11-11 LAB — BMP8+ANION GAP
Anion Gap: 17 mmol/L (ref 10.0–18.0)
BUN/Creatinine Ratio: 20 (ref 9–23)
BUN: 12 mg/dL (ref 6–24)
CO2: 23 mmol/L (ref 20–29)
Calcium: 9.5 mg/dL (ref 8.7–10.2)
Chloride: 97 mmol/L (ref 96–106)
Creatinine, Ser: 0.59 mg/dL (ref 0.57–1.00)
Glucose: 121 mg/dL — ABNORMAL HIGH (ref 70–99)
Potassium: 3.6 mmol/L (ref 3.5–5.2)
Sodium: 137 mmol/L (ref 134–144)
eGFR: 109 mL/min/{1.73_m2} (ref >59)

## 2021-11-15 ENCOUNTER — Ambulatory Visit: Payer: BC Managed Care – PPO | Admitting: Behavioral Health

## 2021-11-15 DIAGNOSIS — F419 Anxiety disorder, unspecified: Secondary | ICD-10-CM

## 2021-11-15 DIAGNOSIS — F17200 Nicotine dependence, unspecified, uncomplicated: Secondary | ICD-10-CM

## 2021-11-15 DIAGNOSIS — F331 Major depressive disorder, recurrent, moderate: Secondary | ICD-10-CM

## 2021-11-15 NOTE — BH Specialist Note (Signed)
Integrated Behavioral Health via Telemedicine Visit  11/15/2021 Zoe Parker [id number]  Number of Integrated Behavioral Health Clinician visits: 1/6 Session Start time: 9:30am Session End time: 10:00am Total time in minutes: 30 min  Referring Provider: Dr. Acquanetta Chain, MD Patient/Family location: Pt is in her car [facility] Provider location: [facility] Office All persons participating in visit: Pt & Clinician Types of Service: Individual psychotherapy  I connected with Janae Sauce Trebilcock and/or Janae Sauce Vermeer's  self  via  Telephone or Video Enabled Telemedicine Application  (Video is Caregility application) and verified that I am speaking with the correct person using two identifiers. Discussed confidentiality: Yes   I discussed the limitations of telemedicine and the availability of in person appointments.  Discussed there is a possibility of technology failure and discussed alternative modes of communication if that failure occurs.  I discussed that engaging in this telemedicine visit, they consent to the provision of behavioral healthcare and the services will be billed under their insurance.  Patient and/or legal guardian expressed understanding and consented to Telemedicine visit: Yes   Presenting Concerns: Patient and/or family reports the following symptoms/concerns: assistance w/Dx of dep & tobacco use Duration of problem: Pt knew she was not doing her best a year ago; Severity of problem: moderate  Patient and/or Family's Strengths/Protective Factors: Social and Emotional competence, Concrete supports in place (healthy food, safe environments, etc.), Sense of purpose, and Physical Health (exercise, healthy diet, medication compliance, etc.)  Goals Addressed: Patient will:  Reduce symptoms of: anxiety and depression   Increase knowledge and/or ability of: coping skills and stress reduction   Demonstrate ability to: Increase healthy adjustment to current life circumstances  Progress  towards Goals: Estb'd today: Pt will meet w/Clinician @ scheduled times  Interventions: Interventions utilized:   Introductory session to estb care Standardized Assessments completed:  screeners prn  Patient and/or Family Response: Pt is receptive to call today & hopes to speak again in 3 wks. Pt prefers the morning for a call.   Assessment: Patient currently experiencing a relief in her dep Sx that have lasted almost a year now. 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  Patient may benefit from getting herself back on a track that is healthy for her. Pt is a natural caregiver & feels it necessary to care for herself. Pt plays a large roll in care for her Family.  Plan: Follow up with behavioral health clinician on : 3 wks for 60 min in the morning on telehealth Behavioral recommendations: Keep a notebook to focus our sessions. Referral(s): Integrated [employer] (In Clinic)  I discussed the assessment and treatment plan with the patient and/or parent/guardian. They were provided an opportunity to ask questions and all were answered. They agreed with the plan and demonstrated an understanding of the instructions.   They were advised to call back or seek an in-person evaluation if the symptoms worsen or if the condition fails to improve as anticipated.  Deneise Lever, LMFT

## 2021-11-17 ENCOUNTER — Ambulatory Visit
Admission: RE | Admit: 2021-11-17 | Discharge: 2021-11-17 | Disposition: A | Discharge status: Home / Self Care | Payer: BC Managed Care – PPO | Source: Ambulatory Visit | Attending: Internal Medicine | Admitting: Internal Medicine

## 2021-11-17 DIAGNOSIS — Z1231 Encounter for screening mammogram for malignant neoplasm of breast: Secondary | ICD-10-CM | POA: Diagnosis not present

## 2021-11-24 ENCOUNTER — Other Ambulatory Visit: Payer: Self-pay | Admitting: Student

## 2021-11-24 ENCOUNTER — Encounter: Payer: BC Managed Care – PPO | Admitting: Internal Medicine

## 2021-11-28 ENCOUNTER — Encounter: Payer: BC Managed Care – PPO | Admitting: Internal Medicine

## 2021-12-12 ENCOUNTER — Telehealth: Payer: Self-pay | Admitting: Behavioral Health

## 2021-12-12 ENCOUNTER — Institutional Professional Consult (permissible substitution): Payer: BC Managed Care – PPO | Admitting: Behavioral Health

## 2021-12-12 NOTE — Telephone Encounter (Signed)
Lft 3 msg for Pt btwn 9:00am - 9:20am. Pt directed to [facility] to [facility] & r/s @ her convenience. ? ?Dr. Monna Fam ?

## 2022-01-18 ENCOUNTER — Encounter: Payer: BC Managed Care – PPO | Admitting: Student

## 2022-02-15 ENCOUNTER — Encounter: Payer: Self-pay | Admitting: *Deleted

## 2022-02-27 ENCOUNTER — Other Ambulatory Visit: Payer: Self-pay | Admitting: Student

## 2022-02-27 DIAGNOSIS — F331 Major depressive disorder, recurrent, moderate: Secondary | ICD-10-CM

## 2022-02-27 NOTE — Telephone Encounter (Signed)
Next appt scheduled 6/19 with PCP. 

## 2022-03-19 ENCOUNTER — Encounter: Payer: BC Managed Care – PPO | Admitting: Student

## 2022-03-19 ENCOUNTER — Telehealth: Payer: Self-pay | Admitting: *Deleted

## 2022-03-19 NOTE — Telephone Encounter (Signed)
LVM for patient to contact the clinic @ [phone] if she is wanting to reschedule this missed appointment.

## 2022-03-20 ENCOUNTER — Encounter: Payer: Self-pay | Admitting: Student

## 2022-07-06 ENCOUNTER — Other Ambulatory Visit: Payer: Self-pay | Admitting: Student

## 2022-07-06 DIAGNOSIS — F331 Major depressive disorder, recurrent, moderate: Secondary | ICD-10-CM

## 2022-07-11 IMAGING — MG MM DIGITAL SCREENING BILAT W/ TOMO AND CAD
6 of 10 series · 6 of 30 positions shown · non-contrast
Comparison: Previous exam(s).

CLINICAL DATA: Screening.

EXAM:
DIGITAL SCREENING BILATERAL MAMMOGRAM WITH TOMOSYNTHESIS AND CAD
TECHNIQUE: Bilateral screening digital craniocaudal and mediolateral oblique
mammograms were obtained. Bilateral screening digital breast
tomosynthesis was performed. The images were evaluated with
computer-aided detection.

[R CC synth-2D (1 of 2)]
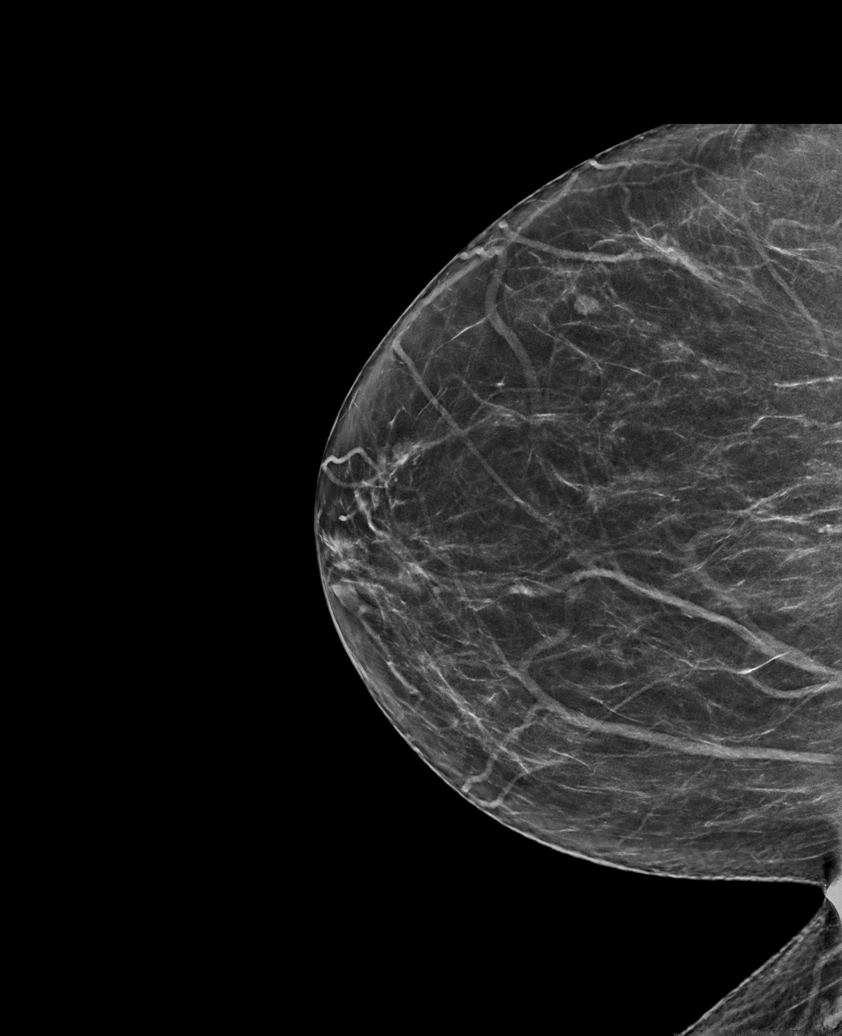

[L MLO synth-2D]
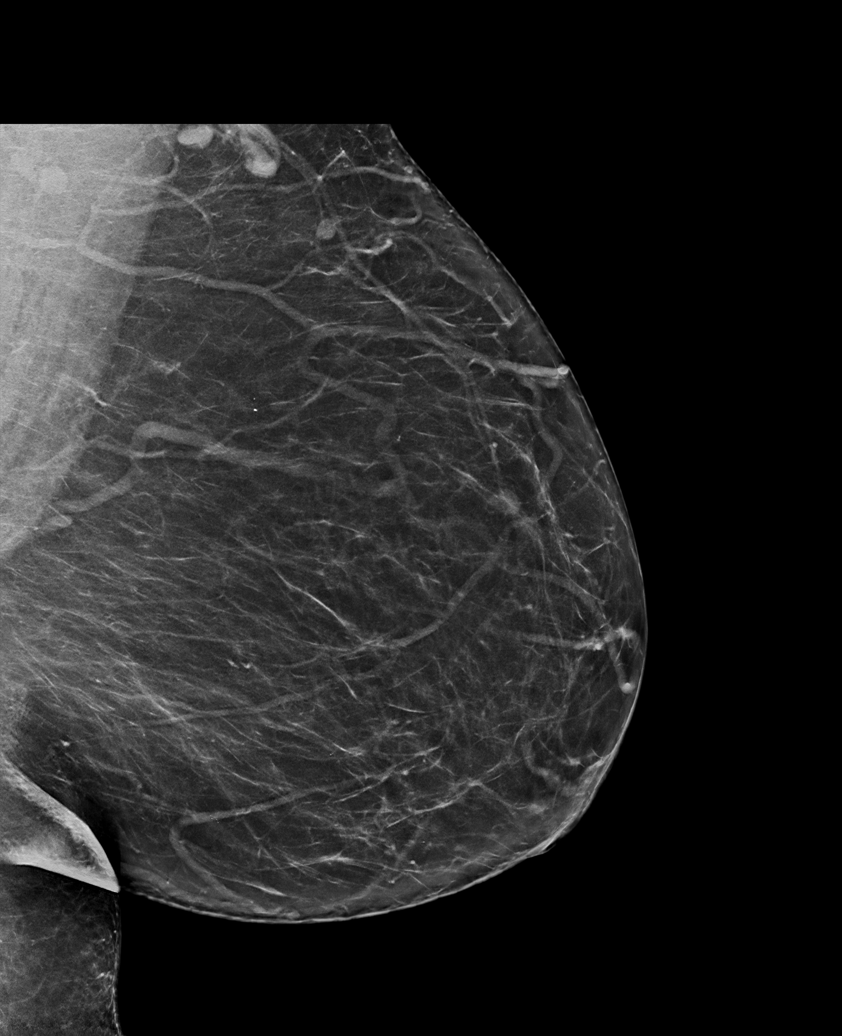

[L CC synth-2D]
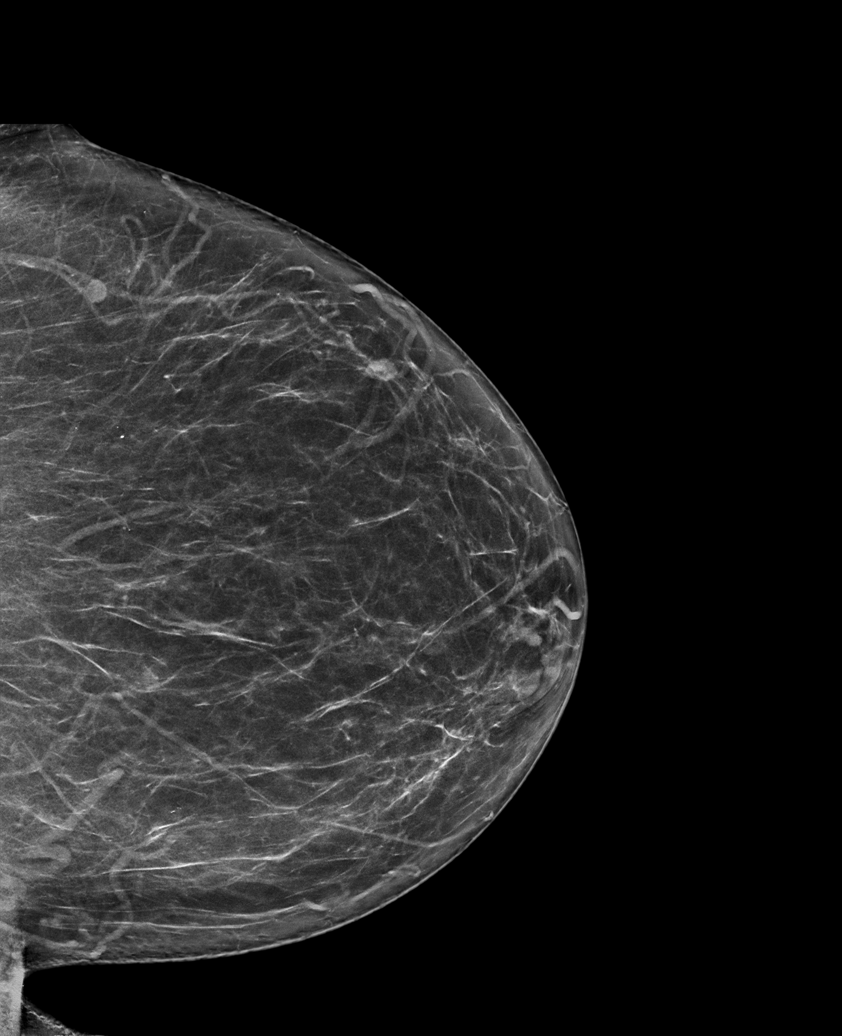

[R MLO synth-2D]
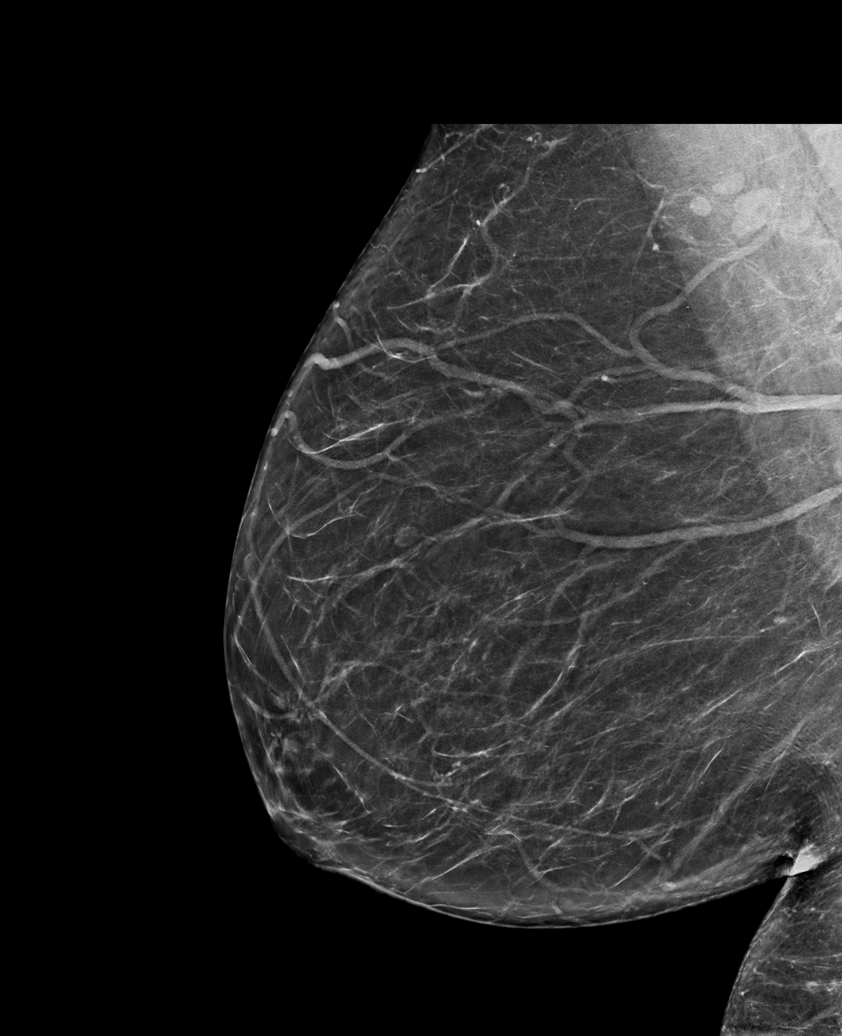

[R CC synth-2D (2 of 2)]
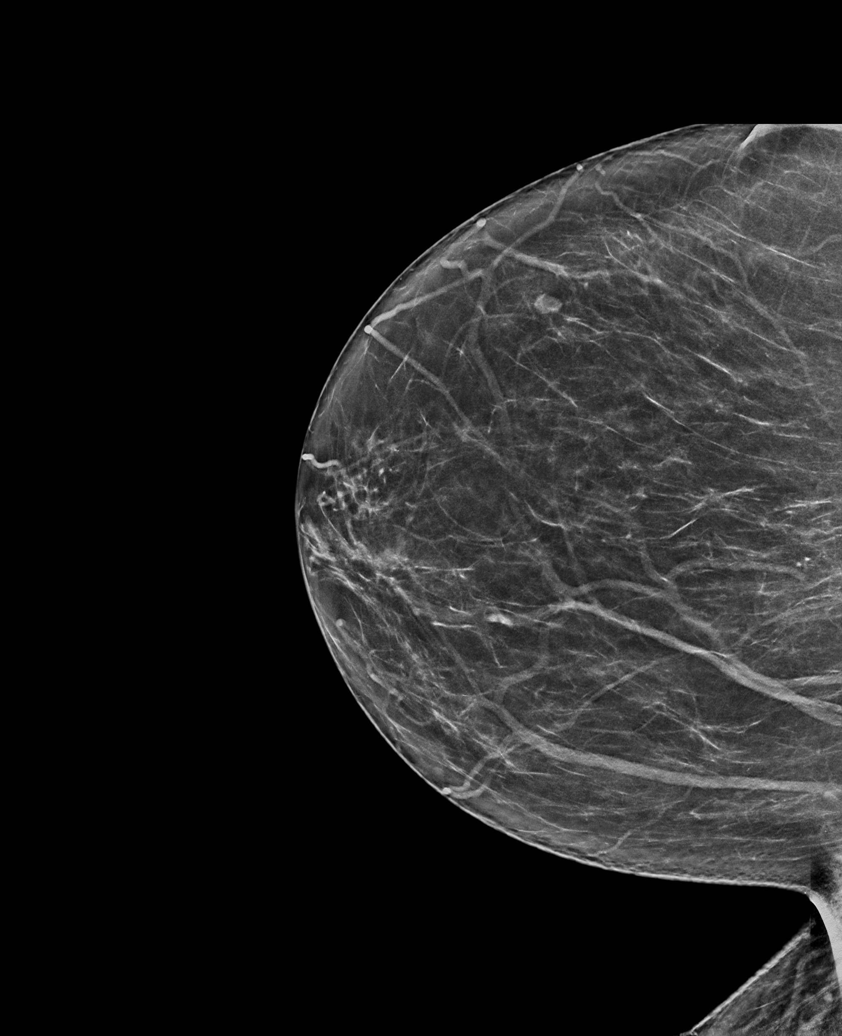

[L CC tomo · tomo slice 37/74.0]
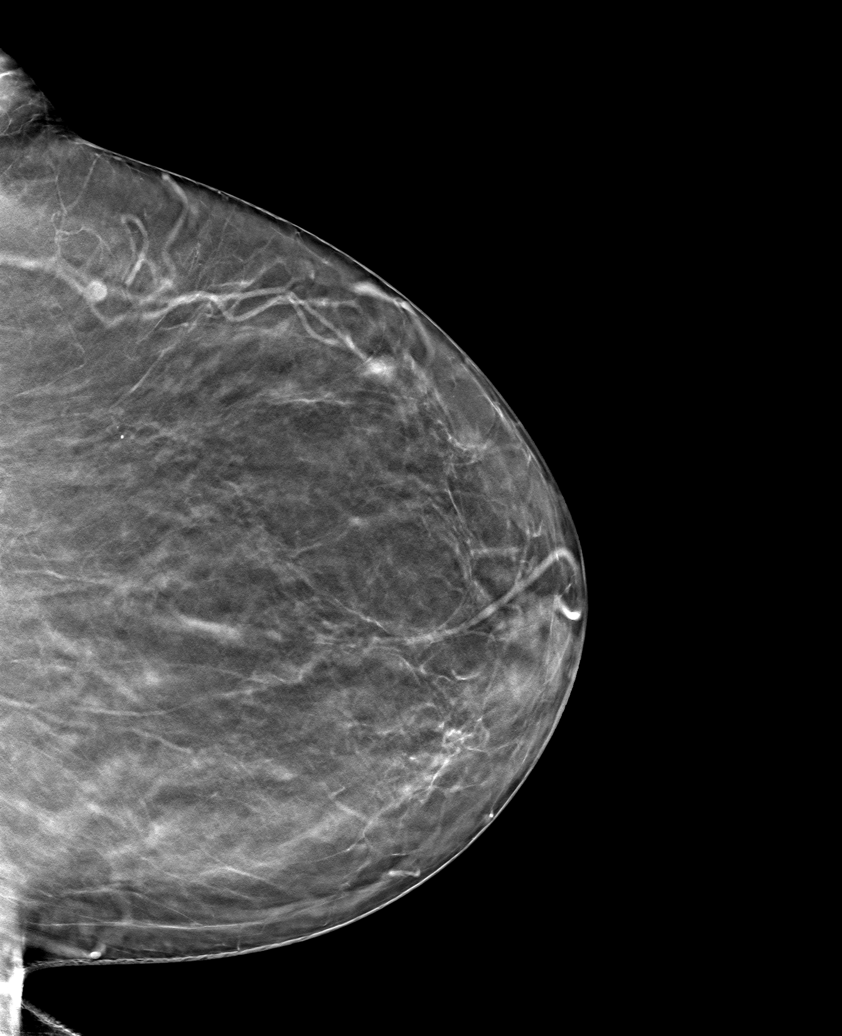

[6 of 30 positions shown; findings below may reference images not displayed]

ACR Breast Density Category b: There are scattered areas of
fibroglandular density.
FINDINGS: There are no findings suspicious for malignancy.
IMPRESSION: No mammographic evidence of malignancy. A result letter of this
screening mammogram will be mailed directly to the patient.

RECOMMENDATION:
Screening mammogram in one year. (Code:51-O-LD2)

BI-RADS CATEGORY  1: Negative.

## 2022-08-12 ENCOUNTER — Other Ambulatory Visit: Payer: Self-pay | Admitting: Family Medicine

## 2022-08-12 DIAGNOSIS — F331 Major depressive disorder, recurrent, moderate: Secondary | ICD-10-CM

## 2022-09-11 ENCOUNTER — Other Ambulatory Visit: Payer: Self-pay | Admitting: Student

## 2022-09-11 DIAGNOSIS — F331 Major depressive disorder, recurrent, moderate: Secondary | ICD-10-CM

## 2022-09-12 ENCOUNTER — Encounter: Payer: BC Managed Care – PPO | Admitting: Internal Medicine

## 2022-10-08 ENCOUNTER — Encounter: Payer: BC Managed Care – PPO | Admitting: Student

## 2022-11-13 ENCOUNTER — Other Ambulatory Visit: Payer: Self-pay

## 2022-11-13 DIAGNOSIS — I10 Essential (primary) hypertension: Secondary | ICD-10-CM

## 2022-11-13 MED ORDER — HYDROCHLOROTHIAZIDE 25 MG PO TABS: 25.0000 mg | ORAL_TABLET | Freq: Every day | ORAL | 3 refills | Status: AC

## 2022-11-13 MED ORDER — AMLODIPINE BESYLATE 10 MG PO TABS: 10.0000 mg | ORAL_TABLET | Freq: Every day | ORAL | 3 refills | Status: AC

## 2023-06-13 ENCOUNTER — Other Ambulatory Visit: Payer: Self-pay | Admitting: Internal Medicine

## 2023-06-13 DIAGNOSIS — F331 Major depressive disorder, recurrent, moderate: Secondary | ICD-10-CM
